# Patient Record
Sex: Female | Born: 1990 | Race: White | Hispanic: No | Marital: Married | Smoking: Current every day smoker
Health system: Southern US, Community
[De-identification: ages and names within clinical notes are randomized; demographics above are authoritative.]

## PROBLEM LIST (undated history)

## (undated) DIAGNOSIS — L309 Dermatitis, unspecified: Secondary | ICD-10-CM

## (undated) DIAGNOSIS — Z9889 Other specified postprocedural states: Secondary | ICD-10-CM

## (undated) DIAGNOSIS — Z1501 Genetic susceptibility to malignant neoplasm of breast: Secondary | ICD-10-CM

## (undated) DIAGNOSIS — N809 Endometriosis, unspecified: Secondary | ICD-10-CM

## (undated) DIAGNOSIS — R112 Nausea with vomiting, unspecified: Secondary | ICD-10-CM

## (undated) DIAGNOSIS — F419 Anxiety disorder, unspecified: Secondary | ICD-10-CM

## (undated) HISTORY — PX: TONSILLECTOMY: SUR1361

## (undated) HISTORY — PX: BREAST SURGERY: SHX581

---

## 2009-03-06 HISTORY — PX: IUD REMOVAL: SHX5392

## 2017-05-19 ENCOUNTER — Emergency Department: Payer: Worker's Compensation

## 2017-05-19 ENCOUNTER — Encounter: Payer: Self-pay | Admitting: Emergency Medicine

## 2017-05-19 ENCOUNTER — Other Ambulatory Visit: Payer: Self-pay

## 2017-05-19 ENCOUNTER — Emergency Department
Admission: EM | Admit: 2017-05-19 | Discharge: 2017-05-19 | Disposition: A | Discharge status: Home / Self Care | Payer: Worker's Compensation | Attending: Emergency Medicine | Admitting: Emergency Medicine

## 2017-05-19 DIAGNOSIS — S8701XA Crushing injury of right knee, initial encounter: Secondary | ICD-10-CM

## 2017-05-19 DIAGNOSIS — S8781XA Crushing injury of right lower leg, initial encounter: Secondary | ICD-10-CM

## 2017-05-19 DIAGNOSIS — M79605 Pain in left leg: Secondary | ICD-10-CM | POA: Diagnosis not present

## 2017-05-19 DIAGNOSIS — M79604 Pain in right leg: Secondary | ICD-10-CM

## 2017-05-19 DIAGNOSIS — S8991XA Unspecified injury of right lower leg, initial encounter: Secondary | ICD-10-CM

## 2017-05-19 DIAGNOSIS — S8702XA Crushing injury of left knee, initial encounter: Secondary | ICD-10-CM

## 2017-05-19 MED ORDER — KETOROLAC TROMETHAMINE 60 MG/2ML IM SOLN
INTRAMUSCULAR | Status: AC
  Administered 2017-05-19: 30 mg
  Filled 2017-05-19: qty 2

## 2017-05-19 MED ORDER — TRAMADOL HCL 50 MG PO TABS: 50.0000 mg | ORAL_TABLET | Freq: Four times a day (QID) | ORAL | 0 refills | Status: DC | PRN

## 2017-05-19 MED ORDER — KETOROLAC TROMETHAMINE 30 MG/ML IJ SOLN: 30.0000 mg | Freq: Once | INTRAMUSCULAR | Status: DC
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
## 2017-05-19 NOTE — ED Notes (Signed)
Pt verbalized understanding of discharge instructions. NAD at this time. 
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
## 2017-12-25 ENCOUNTER — Emergency Department (HOSPITAL_BASED_OUTPATIENT_CLINIC_OR_DEPARTMENT_OTHER)
Admission: EM | Admit: 2017-12-25 | Discharge: 2017-12-25 | Disposition: A | Discharge status: Home / Self Care | Payer: PRIVATE HEALTH INSURANCE | Attending: Emergency Medicine | Admitting: Emergency Medicine

## 2017-12-25 ENCOUNTER — Emergency Department (HOSPITAL_BASED_OUTPATIENT_CLINIC_OR_DEPARTMENT_OTHER): Payer: PRIVATE HEALTH INSURANCE

## 2017-12-25 ENCOUNTER — Encounter (HOSPITAL_BASED_OUTPATIENT_CLINIC_OR_DEPARTMENT_OTHER): Payer: Self-pay | Admitting: Emergency Medicine

## 2017-12-25 ENCOUNTER — Other Ambulatory Visit: Payer: Self-pay

## 2017-12-25 DIAGNOSIS — R51 Headache: Secondary | ICD-10-CM | POA: Insufficient documentation

## 2017-12-25 DIAGNOSIS — Z79899 Other long term (current) drug therapy: Secondary | ICD-10-CM | POA: Insufficient documentation

## 2017-12-25 DIAGNOSIS — R519 Headache, unspecified: Secondary | ICD-10-CM

## 2017-12-25 LAB — COMPREHENSIVE METABOLIC PANEL
ALBUMIN: 3.9 g/dL (ref 3.5–5.0)
ALT: 12 U/L (ref 0–44)
ANION GAP: 10 (ref 5–15)
AST: 18 U/L (ref 15–41)
Alkaline Phosphatase: 60 U/L (ref 38–126)
BUN: 8 mg/dL (ref 6–20)
CO2: 20 mmol/L — AB (ref 22–32)
Calcium: 9 mg/dL (ref 8.9–10.3)
Chloride: 106 mmol/L (ref 98–111)
Creatinine, Ser: 0.7 mg/dL (ref 0.44–1.00)
GFR calc Af Amer: >60 mL/min (ref >60)
GFR calc non Af Amer: >60 mL/min (ref >60)
GLUCOSE: 120 mg/dL — AB (ref 70–99)
POTASSIUM: 3.4 mmol/L — AB (ref 3.5–5.1)
Sodium: 136 mmol/L (ref 135–145)
Total Bilirubin: 0.5 mg/dL (ref 0.3–1.2)
Total Protein: 7.2 g/dL (ref 6.5–8.1)

## 2017-12-25 LAB — CBC WITH DIFFERENTIAL/PLATELET
Basophils Absolute: 0 10*3/uL (ref 0.0–0.1)
Basophils Relative: 0 %
EOS ABS: 0.2 10*3/uL (ref 0.0–0.7)
Eosinophils Relative: 1 %
HCT: 39.2 % (ref 36.0–46.0)
HEMOGLOBIN: 13.4 g/dL (ref 12.0–15.0)
LYMPHS PCT: 19 %
Lymphs Abs: 2.4 10*3/uL (ref 0.7–4.0)
MCH: 29.5 pg (ref 26.0–34.0)
MCHC: 34.2 g/dL (ref 30.0–36.0)
MCV: 86.3 fL (ref 78.0–100.0)
Monocytes Absolute: 0.7 10*3/uL (ref 0.1–1.0)
Monocytes Relative: 6 %
NEUTROS PCT: 74 %
Neutro Abs: 9.3 10*3/uL — ABNORMAL HIGH (ref 1.7–7.7)
Platelets: 177 10*3/uL (ref 150–400)
RBC: 4.54 MIL/uL (ref 3.87–5.11)
RDW: 13 % (ref 11.5–15.5)
WBC: 12.6 10*3/uL — AB (ref 4.0–10.5)

## 2017-12-25 LAB — PREGNANCY, URINE: Preg Test, Ur: NEGATIVE

## 2017-12-25 MED ORDER — METOCLOPRAMIDE HCL 5 MG/ML IJ SOLN
10.0000 mg | Freq: Once | INTRAMUSCULAR | Status: AC
  Administered 2017-12-25: 10 mg via INTRAVENOUS
  Filled 2017-12-25: qty 2

## 2017-12-25 MED ORDER — SODIUM CHLORIDE 0.9 % IV BOLUS
500.0000 mL | Freq: Once | INTRAVENOUS | Status: AC
  Administered 2017-12-25: 500 mL via INTRAVENOUS

## 2017-12-25 MED ORDER — POTASSIUM CHLORIDE CRYS ER 20 MEQ PO TBCR
40.0000 meq | EXTENDED_RELEASE_TABLET | Freq: Once | ORAL | Status: AC
  Administered 2017-12-25: 40 meq via ORAL
  Filled 2017-12-25: qty 2

## 2017-12-25 MED ORDER — METHOCARBAMOL 500 MG PO TABS
750.0000 mg | ORAL_TABLET | Freq: Once | ORAL | Status: AC
  Administered 2017-12-25: 750 mg via ORAL
  Filled 2017-12-25: qty 2

## 2017-12-25 MED ORDER — METHOCARBAMOL 500 MG PO TABS: 500.0000 mg | ORAL_TABLET | Freq: Every evening | ORAL | 0 refills | Status: DC | PRN

## 2017-12-25 MED ORDER — DIPHENHYDRAMINE HCL 50 MG/ML IJ SOLN
25.0000 mg | Freq: Once | INTRAMUSCULAR | Status: AC
  Administered 2017-12-25: 25 mg via INTRAVENOUS
  Filled 2017-12-25: qty 1

## 2017-12-25 MED ORDER — MORPHINE SULFATE (PF) 2 MG/ML IV SOLN
2.0000 mg | Freq: Once | INTRAVENOUS | Status: DC
  Filled 2017-12-25: qty 1

## 2017-12-25 NOTE — ED Notes (Signed)
Pt took tramadol PTA.

## 2017-12-25 NOTE — ED Triage Notes (Signed)
Patient states that she was called in Prednisone and Cleocin and she started to take it from the dentist. She states that she she started to have a reaction and was told by the MD to stop taking them. The patient then states that she woke up with a headache this am and went to Urgent care then sent here.  Patient is crying in pain and holding the base of here neck

## 2017-12-25 NOTE — ED Provider Notes (Signed)
[facility] EMERGENCY DEPARTMENT Provider Note   CSN: [id number] Arrival date & time: 12/25/17  1318     History   Chief Complaint Chief Complaint  Patient presents with  . Headache    HPI Sheri Murray is a 27 y.o. female presenting for evaluation of headache.  Patient states she had dental fillings done 5 days ago.  She has been having continued dental pain, called her dentist yesterday and was prescribed prednisone and clindamycin.  She took this last night, felt poorly afterwards.  This morning when she woke up, she went from sitting to standing when she had acute onset right sided headache.  Headache began at 930, 4 hrs PTA. Since then, she is having severe head pain which is described as a shooting pain starting at the base of her neck going towards the front of her head.  It is always on the right side.  Is worse with certain positions.  It is improved when she is resting and still.  She took tramadol without significant improvement of her symptoms.  She denies fall, trauma, or injury.  She denies fevers or chills.  She denies pain in the midline of her neck.  She denies photophobia or phonophobia.  She reports a history of migraines, states this feels different.  She denies vision changes, slurred speech, decreased concentration, numbness, tingling.    HPI  History reviewed. No pertinent past medical history.  There are no active problems to display for this patient.   Past Surgical History:  Procedure Laterality Date  . BREAST SURGERY     lumpectomy     OB History   None      Home Medications    Prior to Admission medications   Medication Sig Start Date End Date Taking? Authorizing Provider  methocarbamol (ROBAXIN) 500 MG tablet Take 1 tablet (500 mg total) by mouth at bedtime as needed for muscle spasms. 12/25/17   Danzel Marszalek, PA-C  traMADol (ULTRAM) 50 MG tablet Take 1 tablet (50 mg total) by mouth every 6 (six) hours as needed. 05/19/17   Sherrie MustacheFisher,  Roselyn BeringSusan W, PA-C    Family History History reviewed. No pertinent family history.  Social History Social History   Tobacco Use  . Smoking status: Never Smoker  . Smokeless tobacco: Never Used  Substance Use Topics  . Alcohol use: No    Frequency: Never  . Drug use: No     Allergies   Bactrim [sulfamethoxazole-trimethoprim]; Ivp dye [iodinated diagnostic agents]; Latex; and Penicillins   Review of Systems Review of Systems  Neurological: Positive for headaches.  All other systems reviewed and are negative.    Physical Exam Updated Vital Signs BP 114/62 (BP Location: Right Arm)   Pulse 62   Temp 98.1 F (36.7 C) (Oral)   Resp 17   Ht 5\' 5"  (1.651 m)   Wt 70.3 kg   LMP 11/28/2017   SpO2 100%   BMI 25.79 kg/m   Physical Exam  Constitutional: She is oriented to person, place, and time. She appears well-developed and well-nourished. No distress.  Appears in no distress  HENT:  Head: Normocephalic and atraumatic.  No obvious head injury.  OP clear without tonsillar swelling or exudate.  Uvula midline with equal palate rise.  TMs nonerythematous and not bulging bilaterally.  No obvious dental swelling/infection  Eyes: Pupils are equal, round, and reactive to light. Conjunctivae and EOM are normal.  EOMI and PERRLA.  No nystagmus.  Neck: Normal range of motion.  Neck supple.  No tenderness palpation over midline C-spine.  Moving head during the exam without signs of stiffness or meningismus.  Cardiovascular: Normal rate, regular rhythm and intact distal pulses.  Pulmonary/Chest: Effort normal and breath sounds normal. No respiratory distress. She has no wheezes.  Abdominal: Soft. She exhibits no distension and no mass. There is no tenderness. There is no rebound and no guarding.  Musculoskeletal: Normal range of motion.  Strength intact x4.  Sensation intact x4.  Radial and pedal pulses intact bilaterally.  Neurological: She is alert and oriented to person, place, and  time. She displays normal reflexes. No cranial nerve deficit or sensory deficit. Coordination normal.  No obvious neurologic deficits.  Nose to finger intact.  Grip strength intact.  CN intact.  Fine movement and coordination intact.  Skin: Skin is warm and dry. Capillary refill takes less than 2 seconds.  Psychiatric: She has a normal mood and affect.  Nursing note and vitals reviewed.    ED Treatments / Results  Labs (all labs ordered are listed, but only abnormal results are displayed) Labs Reviewed  CBC WITH DIFFERENTIAL/PLATELET - Abnormal; Notable for the following components:      Result Value   WBC 12.6 (*)    Neutro Abs 9.3 (*)    All other components within normal limits  COMPREHENSIVE METABOLIC PANEL - Abnormal; Notable for the following components:   Potassium 3.4 (*)    CO2 20 (*)    Glucose, Bld 120 (*)    All other components within normal limits  PREGNANCY, URINE    EKG EKG Interpretation  Date/Time:  Saturday December 25 2017 14:27:49 EDT Ventricular Rate:  63 PR Interval:    QRS Duration: 86 QT Interval:  427 QTC Calculation: 438 R Axis:   78 Text Interpretation:  Normal sinus rhythm no acute ST/T changes No old tracing to compare Confirmed by Pricilla Loveless [phone]) on 12/25/2017 2:32:12 PM   Radiology Ct Head Wo Contrast  Result Date: 12/25/2017 CLINICAL DATA:  Acute right posterior headache, history of migraines EXAM: CT HEAD WITHOUT CONTRAST TECHNIQUE: Contiguous axial images were obtained from the base of the skull through the vertex without intravenous contrast. COMPARISON:  None. FINDINGS: Brain: No evidence of acute infarction, hemorrhage, hydrocephalus, extra-axial collection or mass lesion/mass effect. Vascular: No hyperdense vessel or unexpected calcification. Skull: Normal. Negative for fracture or focal lesion. Sinuses/Orbits: No acute finding. Other: None. IMPRESSION: Normal head CT without contrast Electronically Signed   By: Judie Petit.  Shick M.D.    On: 12/25/2017 14:34    Procedures Procedures (including critical care time)  Medications Ordered in ED Medications  sodium chloride 0.9 % bolus 500 mL ( Intravenous Stopped 12/25/17 1438)  methocarbamol (ROBAXIN) tablet 750 mg (750 mg Oral Given 12/25/17 1410)  metoCLOPramide (REGLAN) injection 10 mg (10 mg Intravenous Given 12/25/17 1411)  diphenhydrAMINE (BENADRYL) injection 25 mg (25 mg Intravenous Given 12/25/17 1411)  potassium chloride SA (K-DUR,KLOR-CON) CR tablet 40 mEq (40 mEq Oral Given 12/25/17 1442)     Initial Impression / Assessment and Plan / ED Course  I have reviewed the triage vital signs and the nursing notes.  Pertinent labs & imaging results that were available during my care of the patient were reviewed by me and considered in my medical decision making (see chart for details).     Patient presenting for evaluation of headache.  Physical exam shows patient without acute neurologic deficits.  However, reporting severe headache which began 4 hours prior to arrival.  Will obtain CT to rule out subarachnoid, basic labs, start fluids, and treat pain and reassess.  Labs reassuring, mild leukocytosis at 12.6, patient with recent surgery and increased pain.  Doubt this is due to infection such as meningitis.  Potassium mildly low at 3.4, repeated orally.  Pregnancy negative.  CT head without acute findings.  As scan was performed within the 6-hour window, and patient with nonclassic SAH symptoms, low suspicion for [facility].  I do not believe lumbar puncture is necessary.  On reassessment, patient reports symptoms are improved with medications.  Discussed with attending, Dr. Criss Alvine evaluated patient.  Discussed with patient symptoms may be due to occipital nerve irritation, MSK pain, or recent dental procedure.  Patient to stay well-hydrated, use Tylenol/ibuprofen, and follow-up with dentistry regarding her dental pain.  Robaxin for possible MSK sxs. At this time, patient appears safe  for discharge.  Return precautions given.  Patient states she understands and agrees to plan.  Final Clinical Impressions(s) / ED Diagnoses   Final diagnoses:  Acute nonintractable headache, unspecified headache type    ED Discharge Orders         Ordered    methocarbamol (ROBAXIN) 500 MG tablet  At bedtime PRN     12/25/17 1547           Orvil Faraone, PA-C 12/25/17 2054    Pricilla Loveless, MD 12/26/17 [phone]

## 2017-12-25 NOTE — Discharge Instructions (Signed)
Make sure you are staying well-hydrated with water. Use ibuprofen as needed for mild to moderate pain.  You may take your tramadol as needed for severe pain. Use a muscle relaxer as needed for pain or stiffness.  Have caution, as this may make you tired or groggy.  Do not drive or operate heavy machinery while taking this medicine. Follow-up with your dentist regarding your tooth pain. Return to the emergency room if you develop high fevers, vision changes, slurred speech, numbness/tingling, or any new or concerning symptoms.

## 2017-12-25 NOTE — ED Notes (Signed)
ED Provider at bedside. 

## 2019-08-26 ENCOUNTER — Ambulatory Visit (INDEPENDENT_AMBULATORY_CARE_PROVIDER_SITE_OTHER): Payer: PRIVATE HEALTH INSURANCE

## 2019-08-26 ENCOUNTER — Ambulatory Visit
Admission: EM | Admit: 2019-08-26 | Discharge: 2019-08-26 | Disposition: A | Discharge status: Home / Self Care | Payer: PRIVATE HEALTH INSURANCE | Attending: Emergency Medicine | Admitting: Emergency Medicine

## 2019-08-26 ENCOUNTER — Encounter: Payer: Self-pay | Admitting: Emergency Medicine

## 2019-08-26 ENCOUNTER — Other Ambulatory Visit: Payer: Self-pay

## 2019-08-26 DIAGNOSIS — S63502A Unspecified sprain of left wrist, initial encounter: Secondary | ICD-10-CM

## 2019-08-26 DIAGNOSIS — W19XXXA Unspecified fall, initial encounter: Secondary | ICD-10-CM | POA: Diagnosis not present

## 2019-08-26 DIAGNOSIS — M25532 Pain in left wrist: Secondary | ICD-10-CM
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
## 2019-08-26 NOTE — Discharge Instructions (Addendum)

## 2019-08-26 NOTE — ED Provider Notes (Signed)
[name]    CSN: [id number] Arrival date & time: 08/26/19  1158      History   Chief Complaint Chief Complaint  Patient presents with  . Wrist Pain    HPI Sheri Murray is a 29 y.o. female without significant medical history presenting for left wrist pain, decreased range of motion, swelling.  States she fell Wednesday/Thursday.  Did suffer abrasion to left wrist, though has been caring for this at home with OTC medications.  Denying significant pain, numbness, fevers.   History reviewed. No pertinent past medical history.  There are no problems to display for this patient.   Past Surgical History:  Procedure Laterality Date  . BREAST SURGERY     lumpectomy    OB History   No obstetric history on file.      Home Medications    Prior to Admission medications   Medication Sig Start Date End Date Taking? Authorizing Provider  methocarbamol (ROBAXIN) 500 MG tablet Take 1 tablet (500 mg total) by mouth at bedtime as needed for muscle spasms. 12/25/17   Murray, Sophia, PA-C  traMADol (ULTRAM) 50 MG tablet Take 1 tablet (50 mg total) by mouth every 6 (six) hours as needed. 05/19/17   Sheri Mustache Roselyn Bering, PA-C    Family History History reviewed. No pertinent family history.  Social History Social History   Tobacco Use  . Smoking status: Never Smoker  . Smokeless tobacco: Never Used  Substance Use Topics  . Alcohol use: No  . Drug use: No     Allergies   Bactrim [sulfamethoxazole-trimethoprim], Ivp dye [iodinated diagnostic agents], Latex, and Penicillins   Review of Systems As per HPI   Physical Exam Triage Vital Signs ED Triage Vitals [08/26/19 1237]  Enc Vitals Group     BP 135/87     Pulse Rate 78     Resp 16     Temp 98.3 F (36.8 C)     Temp Source Oral     SpO2 99 %     Weight      Height      Head Circumference      Peak Flow      Pain Score      Pain Loc      Pain Edu?      Excl. in GC?    No data found.  Updated  Vital Signs BP 135/87 (BP Location: Right Arm)   Pulse 78   Temp 98.3 F (36.8 C) (Oral)   Resp 16   SpO2 99%   Visual Acuity Right Eye Distance:   Left Eye Distance:   Bilateral Distance:    Right Eye Near:   Left Eye Near:    Bilateral Near:     Physical Exam Constitutional:      General: She is not in acute distress. HENT:     Head: Normocephalic and atraumatic.  Eyes:     General: No scleral icterus.    Pupils: Pupils are equal, round, and reactive to light.  Cardiovascular:     Rate and Rhythm: Normal rate.  Pulmonary:     Effort: Pulmonary effort is normal.  Musculoskeletal:        General: Swelling and tenderness present.     Comments: Decreased ROM second to swelling.  Lateral aspect of wrist more swollen as compared to medial.  Grip strength decreased.  Neurovascularly intact.  1 cm abrasion noted: No foreign body, bleeding, tenderness, warmth.  Skin:    Coloration:  Skin is not jaundiced or pale.  Neurological:     Mental Status: She is alert and oriented to person, place, and time.      UC Treatments / Results  Labs (all labs ordered are listed, but only abnormal results are displayed) Labs Reviewed - No data to display  EKG   Radiology DG Wrist Complete Left  Result Date: 08/26/2019 CLINICAL DATA:  Left wrist pain since falling 2 days ago. EXAM: LEFT WRIST - COMPLETE 3+ VIEW COMPARISON:  None. FINDINGS: The mineralization and alignment are normal. There is no evidence of acute fracture or dislocation. The joint spaces are preserved. No focal soft tissue abnormality identified. IMPRESSION: No acute osseous findings. Electronically Signed   By: Richardean Sale M.D.   On: 08/26/2019 13:04    Procedures Procedures (including critical care time)  Medications Ordered in UC Medications - No data to display  Initial Impression / Assessment and Plan / UC Course  I have reviewed the triage vital signs and the nursing notes.  Pertinent labs & imaging  results that were available during my care of the patient were reviewed by me and considered in my medical decision making (see chart for details).     Patient appears well in office today.  Left wrist with swelling, decreased ROM status post fall.  Left wrist x-ray done office, reviewed by me radiology: Negative for soft tissue abnormality or fracture, dislocation.  Reviewed findings of patient verbalized understanding.  Agreeable to wrist brace: Applied in office which patient tolerated well.  Stressed importance of rehab exercises, which were provided, as well as Ortho follow-up.  Return precautions discussed, patient verbalized understanding and is agreeable to plan. Final Clinical Impressions(s) / UC Diagnoses   Final diagnoses:  Fall, initial encounter  Sprain of left wrist, initial encounter     Discharge Instructions     Recommend RICE: rest, ice, compression, elevation as needed for pain.    Heat therapy (hot compress, warm wash rag, hot showers, etc.) can help relax muscles and soothe muscle aches. Cold therapy (ice packs) can be used to help swelling both after injury and after prolonged use of areas of chronic pain/aches.  For pain: recommend 350 mg-1000 mg of Tylenol (acetaminophen) and/or 200 mg - 800 mg of Advil (ibuprofen, Motrin) every 8 hours as needed.  May alternate between the two throughout the day as they are generally safe to take together.  DO NOT exceed more than 3000 mg of Tylenol or 3200 mg of ibuprofen in a 24 hour period as this could damage your stomach, kidneys, liver, or increase your bleeding risk.     ED Prescriptions    None     PDMP not reviewed this encounter.   Hall[place]Pot[place], [place], [place] 08/26/19 1314

## 2019-10-21 IMAGING — CT CT HEAD W/O CM
3 series · 16 of 47 positions shown, 19 images · non-contrast
Comparison: None.

CLINICAL DATA: Acute right posterior headache, history of migraines

EXAM:
CT HEAD WITHOUT CONTRAST
TECHNIQUE: Contiguous axial images were obtained from the base of the skull
through the vertex without intravenous contrast.

[Series 2: head wo · axial · 0.41mm/px · z∈[-169,-44]mm · 10 of 31 slices shown, 13 images]
[im 3/31  brain]
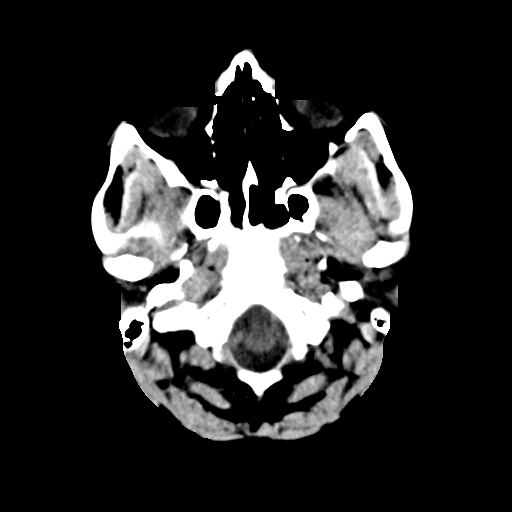
[im 3/31  bone]
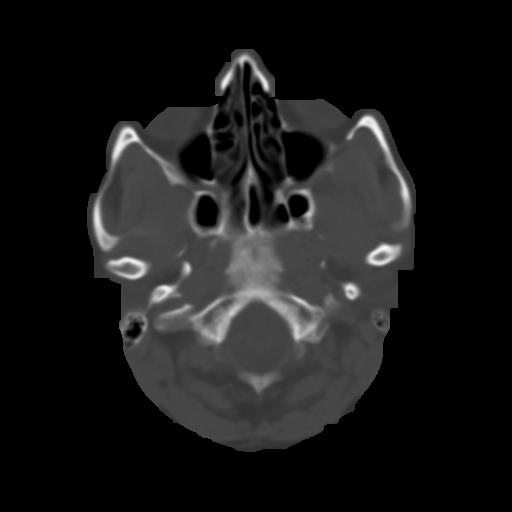
[im 6/31  brain]
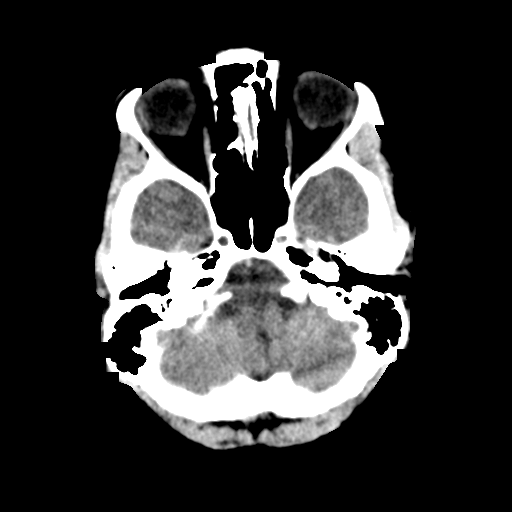
[im 9/31  brain]
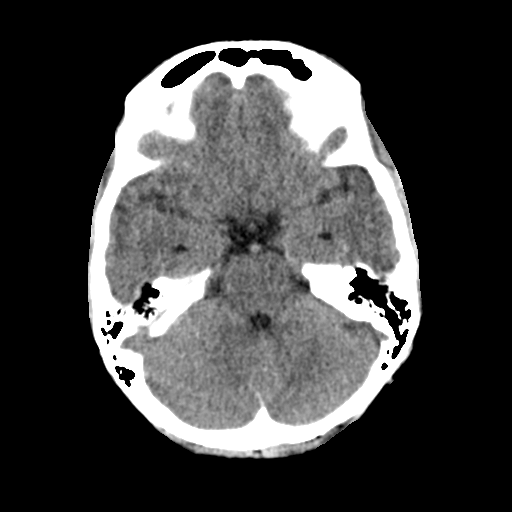
[im 11/31  brain]
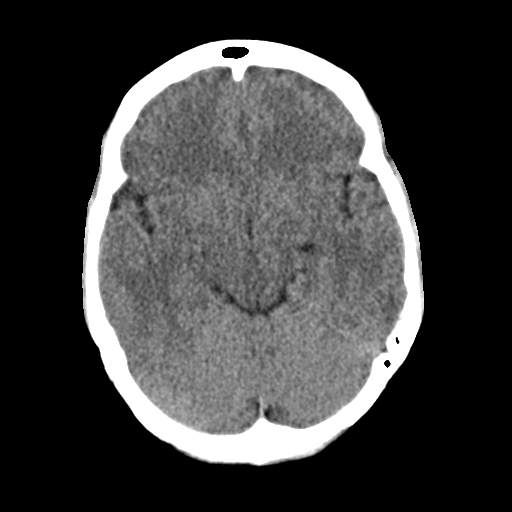
[im 14/31  brain]
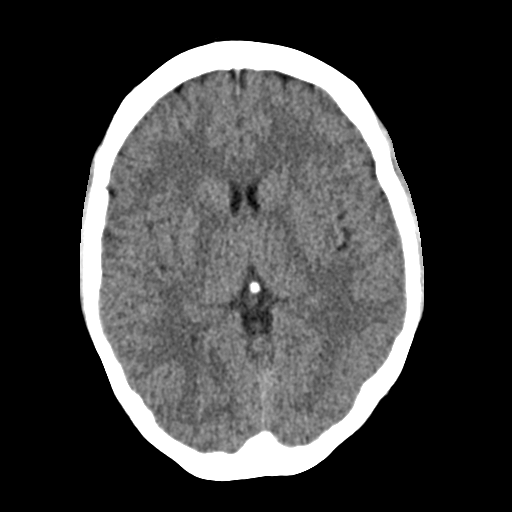
[im 14/31  bone]
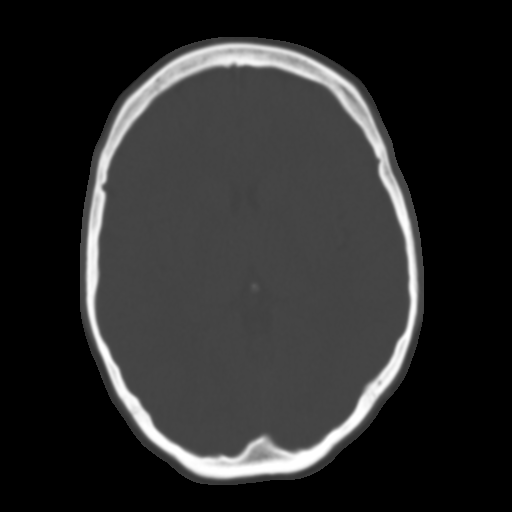
[im 17/31  brain]
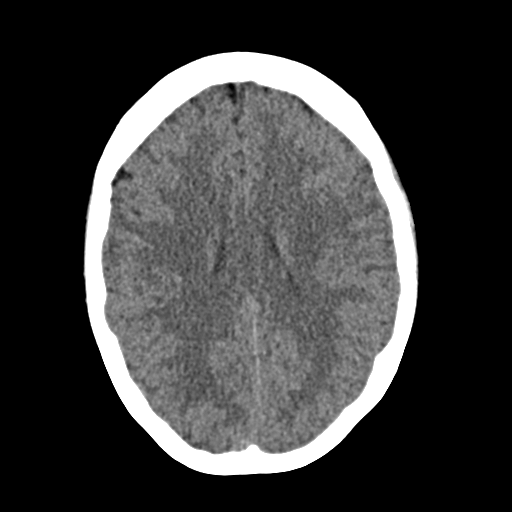
[im 20/31  brain]
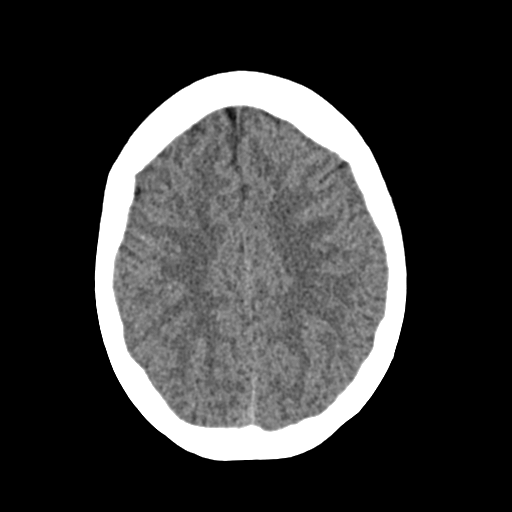
[im 23/31  brain]
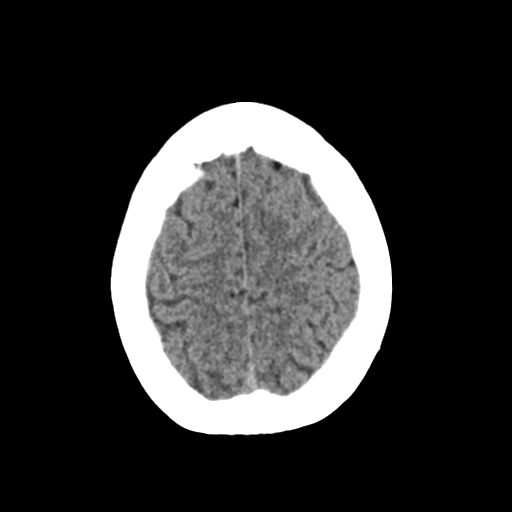
[im 25/31  brain]
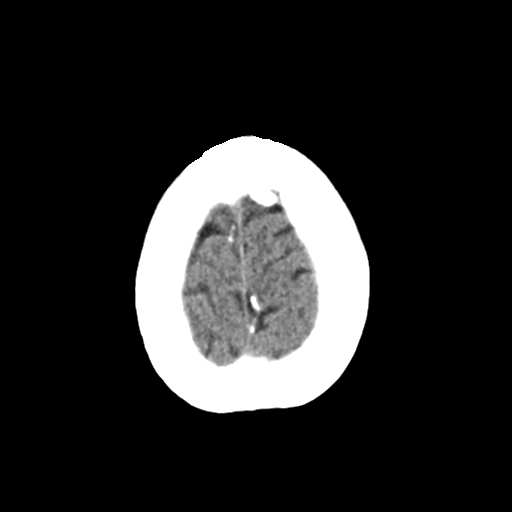
[im 25/31  bone]
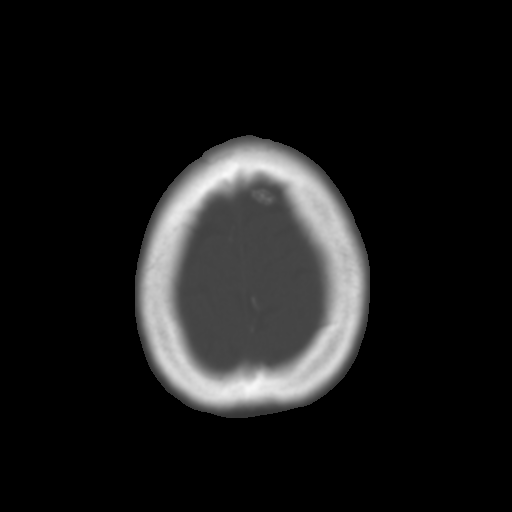
[im 28/31  brain]
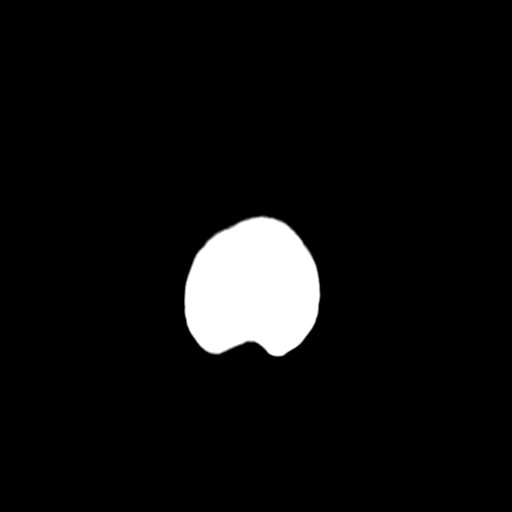

[Series 4: coronal soft · coronal · 0.30mm/px · 3 of 64 slices shown]
[im 22/64  brain]
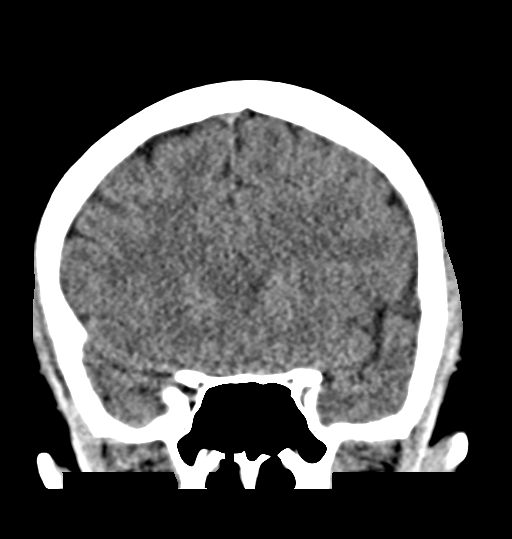
[im 29/64  brain]
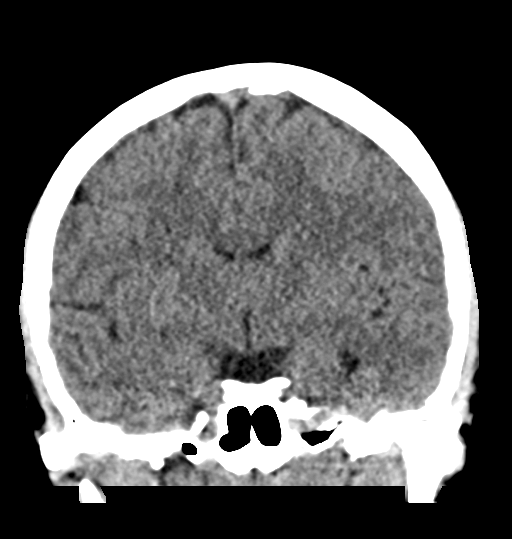
[im 36/64  brain]
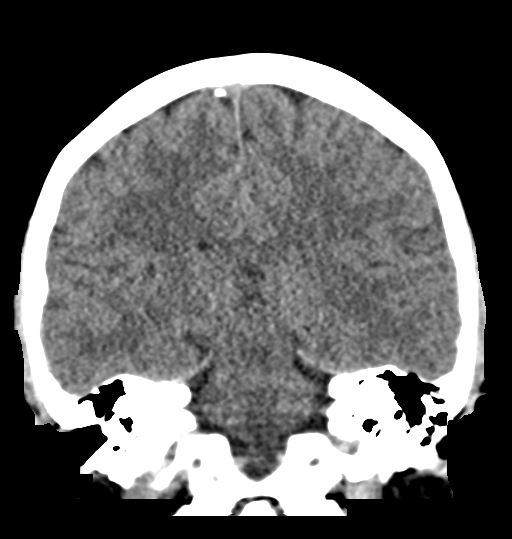

[Series 5: sag soft · sagittal · 0.32mm/px · 3 of 50 slices shown]
[im 17/50  brain]
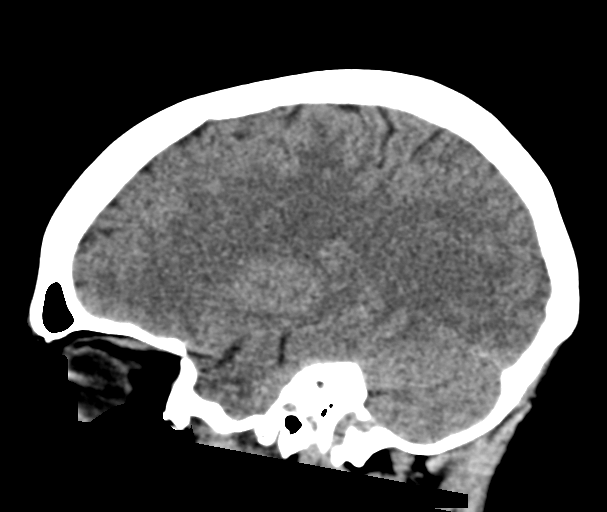
[im 25/50  brain]
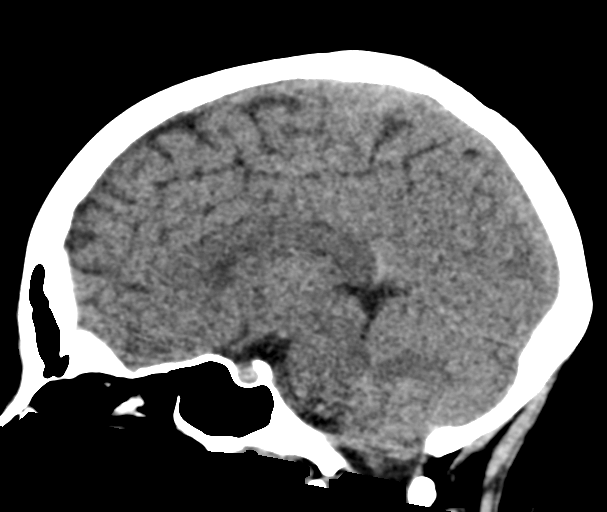
[im 33/50  brain]
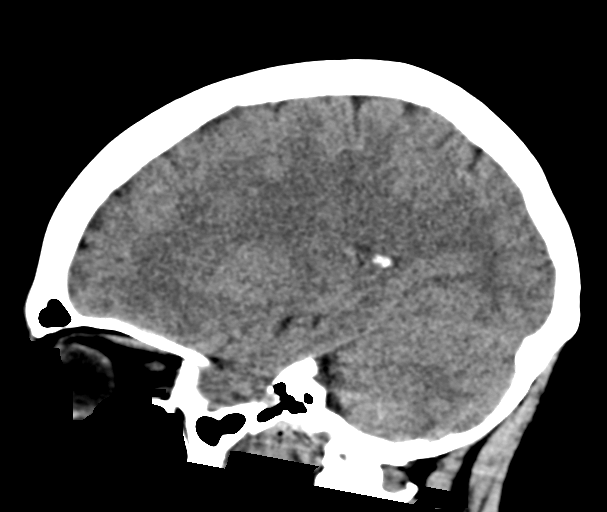

[16 of 47 positions shown; findings below may reference images not displayed]

FINDINGS: Brain: No evidence of acute infarction, hemorrhage, hydrocephalus,
extra-axial collection or mass lesion/mass effect.

Vascular: No hyperdense vessel or unexpected calcification.

Skull: Normal. Negative for fracture or focal lesion.

Sinuses/Orbits: No acute finding.

Other: None.
IMPRESSION: Normal head CT without contrast

## 2020-05-26 ENCOUNTER — Encounter (HOSPITAL_BASED_OUTPATIENT_CLINIC_OR_DEPARTMENT_OTHER): Payer: Self-pay | Admitting: *Deleted

## 2020-05-26 ENCOUNTER — Other Ambulatory Visit: Payer: Self-pay

## 2020-05-26 ENCOUNTER — Emergency Department (HOSPITAL_BASED_OUTPATIENT_CLINIC_OR_DEPARTMENT_OTHER)
Admission: EM | Admit: 2020-05-26 | Discharge: 2020-05-27 | Disposition: A | Discharge status: Home / Self Care | Payer: BC Managed Care – PPO | Attending: Emergency Medicine | Admitting: Emergency Medicine

## 2020-05-26 DIAGNOSIS — L509 Urticaria, unspecified: Secondary | ICD-10-CM | POA: Insufficient documentation

## 2020-05-26 DIAGNOSIS — F17200 Nicotine dependence, unspecified, uncomplicated: Secondary | ICD-10-CM | POA: Insufficient documentation

## 2020-05-26 DIAGNOSIS — Z91040 Latex allergy status: Secondary | ICD-10-CM | POA: Diagnosis not present

## 2020-05-26 DIAGNOSIS — Z882 Allergy status to sulfonamides status: Secondary | ICD-10-CM | POA: Diagnosis not present

## 2020-05-26 DIAGNOSIS — T7840XA Allergy, unspecified, initial encounter: Secondary | ICD-10-CM | POA: Diagnosis not present

## 2020-05-26 HISTORY — DX: Endometriosis, unspecified: N80.9

## 2020-05-26 LAB — CBC WITH DIFFERENTIAL/PLATELET
Abs Immature Granulocytes: 0.04 10*3/uL (ref 0.00–0.07)
Basophils Absolute: 0 10*3/uL (ref 0.0–0.1)
Basophils Relative: 1 %
Eosinophils Absolute: 0.4 10*3/uL (ref 0.0–0.5)
Eosinophils Relative: 8 %
HCT: 39.3 % (ref 36.0–46.0)
Hemoglobin: 13 g/dL (ref 12.0–15.0)
Immature Granulocytes: 1 %
Lymphocytes Relative: 29 %
Lymphs Abs: 1.2 10*3/uL (ref 0.7–4.0)
MCH: 30.3 pg (ref 26.0–34.0)
MCHC: 33.1 g/dL (ref 30.0–36.0)
MCV: 91.6 fL (ref 80.0–100.0)
Monocytes Absolute: 0.4 10*3/uL (ref 0.1–1.0)
Monocytes Relative: 9 %
Neutro Abs: 2.3 10*3/uL (ref 1.7–7.7)
Neutrophils Relative %: 52 %
Platelets: 175 10*3/uL (ref 150–400)
RBC: 4.29 MIL/uL (ref 3.87–5.11)
RDW: 13.2 % (ref 11.5–15.5)
WBC: 4.3 10*3/uL (ref 4.0–10.5)
nRBC: 0 % (ref 0.0–0.2)

## 2020-05-26 LAB — BASIC METABOLIC PANEL
Anion gap: 8 (ref 5–15)
BUN: 11 mg/dL (ref 6–20)
CO2: 22 mmol/L (ref 22–32)
Calcium: 8.8 mg/dL — ABNORMAL LOW (ref 8.9–10.3)
Chloride: 104 mmol/L (ref 98–111)
Creatinine, Ser: 0.89 mg/dL (ref 0.44–1.00)
GFR, Estimated: >60 mL/min (ref >60)
Glucose, Bld: 117 mg/dL — ABNORMAL HIGH (ref 70–99)
Potassium: 3.7 mmol/L (ref 3.5–5.1)
Sodium: 134 mmol/L — ABNORMAL LOW (ref 135–145)

## 2020-05-26 MED ORDER — FAMOTIDINE IN NACL 20-0.9 MG/50ML-% IV SOLN
20.0000 mg | Freq: Once | INTRAVENOUS | Status: AC
  Administered 2020-05-26: 20 mg via INTRAVENOUS
  Filled 2020-05-26: qty 50

## 2020-05-26 MED ORDER — SODIUM CHLORIDE 0.9 % IV SOLN: INTRAVENOUS | Status: DC | PRN

## 2020-05-26 MED ORDER — DIPHENHYDRAMINE HCL 50 MG/ML IJ SOLN
25.0000 mg | Freq: Once | INTRAMUSCULAR | Status: AC
  Administered 2020-05-26: 25 mg via INTRAVENOUS
  Filled 2020-05-26: qty 1

## 2020-05-26 MED ORDER — METHYLPREDNISOLONE SODIUM SUCC 125 MG IJ SOLR
125.0000 mg | Freq: Once | INTRAMUSCULAR | Status: AC
  Administered 2020-05-26: 125 mg via INTRAVENOUS
  Filled 2020-05-26: qty 2

## 2020-05-26 NOTE — ED Provider Notes (Incomplete)
[facility] EMERGENCY DEPARTMENT Provider Note   CSN: [id number] Arrival date & time: 05/26/20  2145     History Chief Complaint  Patient presents with  . Allergic Reaction    Sheri Murray is a 30 y.o. female who presents with concern for allergic reaction that started today with hives on her arms that are spreading to her trunk, neck, and legs.  She states that she was recently diagnosed with shingles on her left shoulder at urgent care and was given prescription for Valtrex and an antibiotic which she started 1 week ago today.  Patient endorses history of allergic reaction to Bactrim in the past.  She states that she does not know what antibiotic she is on at this time.  She does endorse use of oral ketorolac per urgent care provider, and states that she had an injection of Toradol yesterday in the urgent care and was prescribed oral Toradol which she started today.  She states that the reaction started several hours after she took her oral dose of Toradol.  There is associated itching with the rash, and she endorses similar reaction in the past.  Upon review of her chart, it does appear this patient was prescribed Bactrim at the urgent care which has been taking for 1 week.  She has not had any problems eating up to this point.  At time of initial exam to endorses sensation of tightness in her throat, but is able to swallow and is not having any difficulty breathing.  Primary concern is this itchy rash.  She states that she took 10 mg of diphenhydramine at home.  HPI     History reviewed. No pertinent past medical history.  There are no problems to display for this patient.   Past Surgical History:  Procedure Laterality Date  . BREAST SURGERY     lumpectomy     OB History   No obstetric history on file.     Family History  Problem Relation Age of Onset  . Cancer Mother   . Osteoarthritis Mother   . Diabetes Father     Social History   Tobacco Use  .  Smoking status: Never Smoker  . Smokeless tobacco: Never Used  Substance Use Topics  . Alcohol use: Yes  . Drug use: No    Home Medications Prior to Admission medications   Medication Sig Start Date End Date Taking? Authorizing Provider  desogestrel-ethinyl estradiol (MIRCETTE) 0.15-0.02/0.01 MG (21/5) tablet Take by mouth. 09/16/18   [provider]  methocarbamol (ROBAXIN) 500 MG tablet Take 1 tablet (500 mg total) by mouth at bedtime as needed for muscle spasms. 12/25/17   Caccavale, Sophia, PA-C  traMADol (ULTRAM) 50 MG tablet Take 1 tablet (50 mg total) by mouth every 6 (six) hours as needed. 05/19/17   Faythe Ghee, PA-C    Allergies    Bactrim [sulfamethoxazole-trimethoprim], Ivp dye [iodinated diagnostic agents], Latex, and Penicillins  Review of Systems   Review of Systems  Constitutional: Negative.   HENT: Positive for sore throat.        Throat tightness  Eyes: Negative.   Respiratory: Positive for chest tightness. Negative for cough and shortness of breath.   Cardiovascular: Negative.   Gastrointestinal: Negative.   Genitourinary: Negative.   Musculoskeletal: Negative.   Skin: Positive for rash.  Neurological: Negative.   Hematological: Negative.     Physical Exam Updated Vital Signs BP 137/73 (BP Location: Right Arm)   Pulse 80   Temp 98.4  F (36.9 C) (Oral)   Resp 16   SpO2 100%   Physical Exam Vitals and nursing note reviewed.  HENT:     Head: Normocephalic and atraumatic.     Nose: Nose normal.     Mouth/Throat:     Mouth: Mucous membranes are moist.     Tongue: No lesions. Tongue does not deviate from midline.     Pharynx: Oropharynx is clear. Uvula midline. No oropharyngeal exudate, posterior oropharyngeal erythema or uvula swelling.     Tonsils: No tonsillar exudate.  Eyes:     General: Lids are normal. Vision grossly intact.        Right eye: No discharge.        Left eye: No discharge.     Extraocular Movements: Extraocular  movements intact.     Conjunctiva/sclera: Conjunctivae normal.     Pupils: Pupils are equal, round, and reactive to light.  Neck:     Trachea: Trachea and phonation normal.  Cardiovascular:     Rate and Rhythm: Normal rate and regular rhythm.     Pulses: Normal pulses.     Heart sounds: Normal heart sounds. No murmur heard.   Pulmonary:     Effort: Pulmonary effort is normal. No tachypnea, bradypnea or respiratory distress.     Breath sounds: Normal breath sounds. No wheezing or rales.  Chest:     Chest wall: No lacerations, deformity, swelling, tenderness, crepitus or edema.  Abdominal:     General: Bowel sounds are normal. There is no distension.     Palpations: Abdomen is soft.     Tenderness: There is no abdominal tenderness. There is no guarding or rebound.  Musculoskeletal:        General: No deformity.     Cervical back: Neck supple. No tenderness or crepitus. No pain with movement or spinous process tenderness.     Right lower leg: No edema.     Left lower leg: No edema.  Lymphadenopathy:     Cervical: No cervical adenopathy.  Skin:    General: Skin is warm and dry.     Capillary Refill: Capillary refill takes less than 2 seconds.     Findings: Burn and rash present.       Neurological:     Mental Status: She is alert and oriented to person, place, and time. Mental status is at baseline.  Psychiatric:        Mood and Affect: Mood normal.     ED Results / Procedures / Treatments   Labs (all labs ordered are listed, but only abnormal results are displayed) Labs Reviewed - No data to display  EKG None  Radiology No results found.  Procedures Procedures {Remember to document critical care time when appropriate:1}  Medications Ordered in ED Medications  diphenhydrAMINE (BENADRYL) injection 25 mg (has no administration in time range)  methylPREDNISolone sodium succinate (SOLU-MEDROL) 125 mg/2 mL injection 125 mg (has no administration in time range)   famotidine (PEPCID) IVPB 20 mg premix (has no administration in time range)    ED Course  I have reviewed the triage vital signs and the nursing notes.  Pertinent labs & imaging results that were available during my care of the patient were reviewed by me and considered in my medical decision making (see chart for details).    MDM Rules/Calculators/A&P                          *** Final  Clinical Impression(s) / ED Diagnoses Final diagnoses:  None    Rx / DC Orders ED Discharge Orders    None

## 2020-05-26 NOTE — ED Notes (Signed)
ED Provider at bedside. 

## 2020-05-26 NOTE — ED Triage Notes (Addendum)
Pt reports she was Dx with shingles one week ago(rash to left  Shoulder). States she was given bactrim and valtrex from Urgent Care. Today she reports breaking out in hives and feeling Sob. She took a half dose of benadryl pta. Fine rash noted to both legs, hives to upper arms

## 2020-05-26 NOTE — ED Provider Notes (Signed)
[facility] EMERGENCY DEPARTMENT Provider Note   CSN: [id number] Arrival date & time: 05/26/20  2145     History Chief Complaint  Patient presents with   Allergic Reaction    Sheri Murray is a 30 y.o. female who presents with concern for allergic reaction that started today with hives on her arms that are spreading to her trunk, neck, and legs.  She states that she was recently diagnosed with shingles on her left shoulder at urgent care and was given prescription for Valtrex and an antibiotic (she does not know the name) which she started 1 week ago today.  Patient endorses history of allergic reaction to Bactrim in the past.  She states that she does not know what antibiotic she is on at this time.  She does endorse use of oral ketorolac per urgent care provider, and states that she had an injection of Toradol yesterday in the urgent care which she tolerated well, and was prescribed oral Toradol which she started today.  She states that the reaction started several hours after she took her oral dose of Toradol.  There is associated itching with the rash, and she endorses similar reaction in the past.  Upon review of her chart, it does appear this patient was prescribed Bactrim at the urgent care which she has been taking for 1 week, which is unfortunately on her allergy list.  She has not had any problems breathing up to this point.  At time of initial exam to endorses sensation of tightness in her throat, but is able to swallow and is not having any difficulty breathing.  Primary concern is this itchy rash.  She states that she took 10 mg of diphenhydramine at home.  I have personally reviewed this patient's medical records she has history of endometriosis and allergic reactions in the past. She is on OCPs daily.   HPI     Past Medical History:  Diagnosis Date   Endometriosis     There are no problems to display for this patient.   Past Surgical History:  Procedure  Laterality Date   BREAST SURGERY     lumpectomy   TONSILLECTOMY       OB History   No obstetric history on file.     Family History  Problem Relation Age of Onset   Cancer Mother    Osteoarthritis Mother    Diabetes Father     Social History   Tobacco Use   Smoking status: Current Every Day Smoker   Smokeless tobacco: Never Used  Vaping Use   Vaping Use: Never used  Substance Use Topics   Alcohol use: Not Currently   Drug use: No    Home Medications Prior to Admission medications   Medication Sig Start Date End Date Taking? Authorizing Provider  famotidine (PEPCID) 20 MG tablet Take 1 tablet (20 mg total) by mouth 2 (two) times daily for 5 days. 05/27/20 06/01/20 Yes Daemon Dowty, Eugene Gaviaebekah R, PA-C  predniSONE (DELTASONE) 50 MG tablet Take 1 tablet (50 mg total) by mouth daily for 5 days. 05/27/20 06/01/20 Yes Mariellen Blaney, Eugene Gaviaebekah R, PA-C  desogestrel-ethinyl estradiol (MIRCETTE) 0.15-0.02/0.01 MG (21/5) tablet Take by mouth. 09/16/18   [provider]  methocarbamol (ROBAXIN) 500 MG tablet Take 1 tablet (500 mg total) by mouth at bedtime as needed for muscle spasms. 12/25/17   Caccavale, Sophia, PA-C  traMADol (ULTRAM) 50 MG tablet Take 1 tablet (50 mg total) by mouth every 6 (six) hours as needed. 05/19/17  Faythe Ghee, PA-C    Allergies    Ivp dye [iodinated diagnostic agents], Latex, Bactrim [sulfamethoxazole-trimethoprim], and Penicillins  Review of Systems   Review of Systems  Constitutional: Negative.   HENT: Positive for sore throat.        Throat tightness  Eyes: Negative.   Respiratory: Negative.  Negative for cough, chest tightness and shortness of breath.   Cardiovascular: Negative.   Gastrointestinal: Negative.   Genitourinary: Negative.   Musculoskeletal: Negative.   Skin: Positive for rash.  Neurological: Negative.   Hematological: Negative.     Physical Exam Updated Vital Signs BP 119/64    Pulse 66    Temp 98.4 F (36.9 C)  (Oral)    Resp 19    Ht 5\' 5"  (1.651 m)    Wt 77.1 kg    LMP 05/17/2020    SpO2 100%    BMI 28.29 kg/m   Physical Exam Vitals and nursing note reviewed.  HENT:     Head: Normocephalic and atraumatic.     Nose: Nose normal.     Mouth/Throat:     Mouth: Mucous membranes are moist.     Dentition: Normal dentition.     Tongue: No lesions. Tongue does not deviate from midline.     Palate: No lesions.     Pharynx: Oropharynx is clear. Uvula midline. No pharyngeal swelling, oropharyngeal exudate, posterior oropharyngeal erythema or uvula swelling.     Tonsils: No tonsillar exudate or tonsillar abscesses.     Comments: No lingual /sublingual/ submental edema or TTP. NO oropharyngeal edema Eyes:     General: Lids are normal. Vision grossly intact.        Right eye: No discharge.        Left eye: No discharge.     Extraocular Movements: Extraocular movements intact.     Conjunctiva/sclera: Conjunctivae normal.     Pupils: Pupils are equal, round, and reactive to light.  Neck:     Trachea: Trachea and phonation normal.  Cardiovascular:     Rate and Rhythm: Normal rate and regular rhythm.     Pulses: Normal pulses.     Heart sounds: Normal heart sounds. No murmur heard.   Pulmonary:     Effort: Pulmonary effort is normal. No tachypnea, bradypnea or respiratory distress.     Breath sounds: Normal breath sounds. No wheezing or rales.  Chest:     Chest wall: No lacerations, deformity, swelling, tenderness, crepitus or edema.  Abdominal:     General: Bowel sounds are normal. There is no distension.     Palpations: Abdomen is soft.     Tenderness: There is no abdominal tenderness. There is no guarding or rebound.  Musculoskeletal:        General: No deformity.     Cervical back: Full passive range of motion without pain, normal range of motion and neck supple. No tenderness or crepitus. No pain with movement or spinous process tenderness.     Right lower leg: No edema.     Left lower leg:  No edema.  Lymphadenopathy:     Cervical: No cervical adenopathy.  Skin:    General: Skin is warm and dry.     Capillary Refill: Capillary refill takes less than 2 seconds.     Findings: Burn and rash present.       Neurological:     General: No focal deficit present.     Mental Status: She is alert and oriented to person, place, and  time. Mental status is at baseline.  Psychiatric:        Mood and Affect: Mood normal.     ED Results / Procedures / Treatments   Labs (all labs ordered are listed, but only abnormal results are displayed) Labs Reviewed  BASIC METABOLIC PANEL - Abnormal; Notable for the following components:      Result Value   Sodium 134 (*)    Glucose, Bld 117 (*)    Calcium 8.8 (*)    All other components within normal limits  CBC WITH DIFFERENTIAL/PLATELET    EKG None  Radiology No results found.  Procedures Procedures   Medications Ordered in ED Medications  diphenhydrAMINE (BENADRYL) injection 25 mg (25 mg Intravenous Given 05/26/20 2232)  methylPREDNISolone sodium succinate (SOLU-MEDROL) 125 mg/2 mL injection 125 mg (125 mg Intravenous Given 05/26/20 2231)  famotidine (PEPCID) IVPB 20 mg premix (0 mg Intravenous Stopped 05/27/20 0100)    ED Course  I have reviewed the triage vital signs and the nursing notes.  Pertinent labs & imaging results that were available during my care of the patient were reviewed by me and considered in my medical decision making (see chart for details).  Clinical Course as of 05/28/20 2107  Wynelle Link May 26, 2020  2315 Patient reevaluated with improvement in her symptoms. She states that her throat is no longer itchy, does not feel tight. There is improvement in the itching of her rash, and rash appears to be resolving on her neck, arms, and anterior thighs. Cardiopulmonary exam remains normal.  [RS]  Mon May 27, 2020  0030 Patient reevaluated with even further improvement in her rash, near completely resolved on her arms,  neck, shoulders, and anterior thighs. She is no longer experiencing itching or tight sensation in her throat. At this time do not feel any further observation is warranted given there is no administration of epinephrine and patient seems to be improving with steroids and antihistamine medications. [RS]    Clinical Course User Index [RS] Farrel Guimond, Eugene Gavia, PA-C   MDM Rules/Calculators/A&P                         30 year-old female who presents with concern for hives and tightness in her throat after unknowninggly beeing prescribed bactrim, and antibiotic to which she has a know allergy.  Vitals are normal on intake. Cardiopulmonary exam is normal, abdominal exam is benign. Oropharyngeal exam without lingual or sublingual or submental edema. No edema or crepitus of the neck, no change in the patient's voice. There is an urticarial rash on the arms, neck, trunk, and thighs with associated itching.   Differential diagnosis includes but is not limited to anaphylaxis / allergic reaction, angioedema, cold urticaria.   Patient's presentation most consistent with allergic reaction. Will administer solumedrol, benadryl, and pepcid for allergic reaction.   Patient observed for 2 hours after administration of antihistamines and steroid, with significant improvement. Please see ED course above for more detail. Giving reassuring cardiopulmonary exam, improving rash and symptomatology no further work up is warranted in the ED at this time. Very little concern for rebound reaction given no epinephrine was administered. No further work up or observation is warranted in the ED at this time. Will discharge with instructions to continue OTC antihistamines and will prescribe steroid burst.   Mahaila voiced understanding of her medical evaluation and treatment plan. Each of her questions was answered to her expressed satisfaction. Return precautions were given. Patient is  well appearing, stable, and appropriate for  discharge at this time.   This chart was dictated using voice recognition software, Dragon. Despite the best efforts of this provider to proofread and correct errors, errors may still occur which can change documentation meaning.   Final Clinical Impression(s) / ED Diagnoses Final diagnoses:  Allergic reaction, initial encounter    Rx / DC Orders ED Discharge Orders         Ordered    famotidine (PEPCID) 20 MG tablet  2 times daily        05/27/20 0029    predniSONE (DELTASONE) 50 MG tablet  Daily        05/27/20 0029           Sherrilee Gilles 05/28/20 2107    Gwyneth Sprout, MD 05/29/20 [phone]

## 2020-05-27 MED ORDER — PREDNISONE 50 MG PO TABS: 50.0000 mg | ORAL_TABLET | Freq: Every day | ORAL | 0 refills | Status: AC

## 2020-05-27 MED ORDER — FAMOTIDINE 20 MG PO TABS: 20.0000 mg | ORAL_TABLET | Freq: Two times a day (BID) | ORAL | 0 refills | Status: DC

## 2020-05-27 NOTE — Discharge Instructions (Addendum)
You were seen in the ED and found to have an allergic reaction, likely secondary to the bactrim you were prescribed last week.   You were administered Benadryl, Pepcid, and steroids in the emergency department with significant provement in your symptoms.  To prevent recurrence of your reaction he should take the following medications for the next few days: -Benadryl every 6 hours for the next 48 hours.  After that you should take Zyrtec or Claritin or Allegra daily for the next 10 days.  You may pick this up over-the-counter. If you are not willing to take Benadryl for the next 2 days you may take a double dose of Zyrtec or Claritin or allegra as an alternative and then begin a single dose after 48 hours. -Pepcid twice daily for the next 5 days (as prescribed your pharmacy) -As well as a steroid called prednisone which you should take daily for the next 5 days.  The Pepcid and prednisone have been prescribed to your pharmacy.  Return to emergency department if develop any new tightness or swelling in your throat, recurrent rash, difficulty breathing, nausea or vomiting that does not stop, or any other new severe symptoms.

## 2020-09-02 ENCOUNTER — Emergency Department (HOSPITAL_BASED_OUTPATIENT_CLINIC_OR_DEPARTMENT_OTHER): Payer: BC Managed Care – PPO

## 2020-09-02 ENCOUNTER — Other Ambulatory Visit: Payer: Self-pay

## 2020-09-02 ENCOUNTER — Emergency Department (HOSPITAL_BASED_OUTPATIENT_CLINIC_OR_DEPARTMENT_OTHER)
Admission: EM | Admit: 2020-09-02 | Discharge: 2020-09-03 | Disposition: A | Discharge status: Home / Self Care | Payer: BC Managed Care – PPO | Attending: Emergency Medicine | Admitting: Emergency Medicine

## 2020-09-02 ENCOUNTER — Encounter (HOSPITAL_BASED_OUTPATIENT_CLINIC_OR_DEPARTMENT_OTHER): Payer: Self-pay | Admitting: Emergency Medicine

## 2020-09-02 DIAGNOSIS — Z91040 Latex allergy status: Secondary | ICD-10-CM | POA: Diagnosis not present

## 2020-09-02 DIAGNOSIS — R002 Palpitations: Secondary | ICD-10-CM | POA: Insufficient documentation

## 2020-09-02 DIAGNOSIS — F17200 Nicotine dependence, unspecified, uncomplicated: Secondary | ICD-10-CM | POA: Insufficient documentation

## 2020-09-02 DIAGNOSIS — R072 Precordial pain: Secondary | ICD-10-CM | POA: Diagnosis not present

## 2020-09-02 DIAGNOSIS — R079 Chest pain, unspecified: Secondary | ICD-10-CM

## 2020-09-02 DIAGNOSIS — R42 Dizziness and giddiness: Secondary | ICD-10-CM | POA: Diagnosis not present

## 2020-09-02 DIAGNOSIS — R0602 Shortness of breath: Secondary | ICD-10-CM | POA: Insufficient documentation

## 2020-09-02 HISTORY — DX: Genetic susceptibility to malignant neoplasm of breast: Z15.01

## 2020-09-02 LAB — CBC
HCT: 40.9 % (ref 36.0–46.0)
Hemoglobin: 13.8 g/dL (ref 12.0–15.0)
MCH: 30.3 pg (ref 26.0–34.0)
MCHC: 33.7 g/dL (ref 30.0–36.0)
MCV: 89.7 fL (ref 80.0–100.0)
Platelets: 193 10*3/uL (ref 150–400)
RBC: 4.56 MIL/uL (ref 3.87–5.11)
RDW: 12.4 % (ref 11.5–15.5)
WBC: 9.6 10*3/uL (ref 4.0–10.5)
nRBC: 0 % (ref 0.0–0.2)

## 2020-09-02 LAB — BASIC METABOLIC PANEL
Anion gap: 10 (ref 5–15)
BUN: 11 mg/dL (ref 6–20)
CO2: 24 mmol/L (ref 22–32)
Calcium: 8.8 mg/dL — ABNORMAL LOW (ref 8.9–10.3)
Chloride: 105 mmol/L (ref 98–111)
Creatinine, Ser: 0.83 mg/dL (ref 0.44–1.00)
GFR, Estimated: >60 mL/min (ref >60)
Glucose, Bld: 115 mg/dL — ABNORMAL HIGH (ref 70–99)
Potassium: 3.5 mmol/L (ref 3.5–5.1)
Sodium: 139 mmol/L (ref 135–145)

## 2020-09-02 LAB — TROPONIN I (HIGH SENSITIVITY): Troponin I (High Sensitivity): <2 ng/L (ref <18)

## 2020-09-02 NOTE — ED Triage Notes (Signed)
Patient presents with complaints of chest pressure onset 20 minutes ago; states woke up this am lightheaded and having palpitations. States intermittent nausea. Denies vomiting. States shortness of breath. States started zpak 3 days ago; also taking med for anxiety.

## 2020-09-03 LAB — D-DIMER, QUANTITATIVE: D-Dimer, Quant: <0.27 ug/mL-FEU (ref 0.00–0.50)

## 2020-09-03 LAB — TROPONIN I (HIGH SENSITIVITY): Troponin I (High Sensitivity): <2 ng/L (ref <18)

## 2020-09-03 NOTE — ED Provider Notes (Signed)
[facility]PARTMENT Provider Note   CSN: [id number] Arrival date & time: 09/02/20  2107     History Chief Complaint  Patient presents with  . Chest Pain    Sheri Murray is a 30 y.o. female.  30 yo F with a cc of palpitations lightheadedness and chest pressure.  This been going on for a couple days but worsening about 4 or 5 hours ago.  The patient recently had a dental extraction and is on azithromycin.  She looked up drug drug interactions and was concerned that it may be interacting with one of her anxiety medications.  She decided then to come to the ED for evaluation.  Denies cough congestion or fever.  Feels like her dental extraction is healing well.  She denies any abdominal pain nausea vomiting or diarrhea.  Symptoms tend to get worse when she gets up to ambulate.  She is on estrogen for birth control.  Is borderline hypertensive borderline cholesterol level borderline diabetes not on medicine for any.  Pack and a half smoker.  Father had an MI.  Denies history of PE or DVT denies hemoptysis denies unilateral lower extremity edema.  The history is provided by the patient.  Chest Pain Pain location:  Substernal area Pain quality: pressure   Pain radiates to:  Does not radiate Pain severity:  Moderate Onset quality:  Gradual Duration:  2 days Timing:  Constant Progression:  Worsening Chronicity:  New Relieved by:  Nothing Worsened by:  Nothing Ineffective treatments:  None tried Associated symptoms: palpitations and shortness of breath   Associated symptoms: no dizziness, no fever, no headache, no nausea and no vomiting        Past Medical History:  Diagnosis Date  . BRCA1 positive   . Endometriosis     There are no problems to display for this patient.   Past Surgical History:  Procedure Laterality Date  . BREAST SURGERY     lumpectomy  . TONSILLECTOMY       OB History   No obstetric history on file.     Family History   Problem Relation Age of Onset  . Cancer Mother   . Osteoarthritis Mother   . Diabetes Father     Social History   Tobacco Use  . Smoking status: Current Every Day Smoker  . Smokeless tobacco: Never Used  Vaping Use  . Vaping Use: Never used  Substance Use Topics  . Alcohol use: Not Currently  . Drug use: No    Home Medications Prior to Admission medications   Medication Sig Start Date End Date Taking? Authorizing Provider  desogestrel-ethinyl estradiol (MIRCETTE) 0.15-0.02/0.01 MG (21/5) tablet Take by mouth. 09/16/18   [provider]  famotidine (PEPCID) 20 MG tablet Take 1 tablet (20 mg total) by mouth 2 (two) times daily for 5 days. 05/27/20 06/01/20  Sponseller, Gypsy Balsam, PA-C  methocarbamol (ROBAXIN) 500 MG tablet Take 1 tablet (500 mg total) by mouth at bedtime as needed for muscle spasms. 12/25/17   Caccavale, Sophia, PA-C  traMADol (ULTRAM) 50 MG tablet Take 1 tablet (50 mg total) by mouth every 6 (six) hours as needed. 05/19/17   Fisher, Linden Dolin, PA-C    Allergies    Ivp dye [iodinated diagnostic agents], Latex, Bactrim [sulfamethoxazole-trimethoprim], and Penicillins  Review of Systems   Review of Systems  Constitutional: Negative for chills and fever.  HENT: Negative for congestion and rhinorrhea.   Eyes: Negative for redness and visual disturbance.  Respiratory:  Positive for shortness of breath. Negative for wheezing.   Cardiovascular: Positive for chest pain and palpitations.  Gastrointestinal: Negative for nausea and vomiting.  Genitourinary: Negative for dysuria and urgency.  Musculoskeletal: Negative for arthralgias and myalgias.  Skin: Negative for pallor and wound.  Neurological: Positive for light-headedness. Negative for dizziness and headaches.    Physical Exam Updated Vital Signs BP 123/66   Pulse 63   Temp 98.2 F (36.8 C) (Oral)   Resp (!) 21   Ht _0  (1.651 m)   Wt 77.1 kg   LMP 08/05/2020 Comment: on birth control  SpO2 97%    BMI 28.29 kg/m   Physical Exam Vitals and nursing note reviewed.  Constitutional:      General: She is not in acute distress.    Appearance: She is well-developed. She is not diaphoretic.  HENT:     Head: Normocephalic and atraumatic.  Eyes:     Pupils: Pupils are equal, round, and reactive to light.  Cardiovascular:     Rate and Rhythm: Normal rate and regular rhythm.     Heart sounds: No murmur heard. No friction rub. No gallop.   Pulmonary:     Effort: Pulmonary effort is normal.     Breath sounds: No wheezing or rales.  Abdominal:     General: There is no distension.     Palpations: Abdomen is soft.     Tenderness: There is abdominal tenderness (mild lower abdominal tenderness, chronic per patient).  Musculoskeletal:        General: No tenderness.     Cervical back: Normal range of motion and neck supple.  Skin:    General: Skin is warm and dry.  Neurological:     Mental Status: She is alert and oriented to person, place, and time.  Psychiatric:        Behavior: Behavior normal.     ED Results / Procedures / Treatments   Labs (all labs ordered are listed, but only abnormal results are displayed) Labs Reviewed  BASIC METABOLIC PANEL - Abnormal; Notable for the following components:      Result Value   Glucose, Bld 115 (*)    Calcium 8.8 (*)    All other components within normal limits  CBC  D-DIMER, QUANTITATIVE  PREGNANCY, URINE  TROPONIN I (HIGH SENSITIVITY)  TROPONIN I (HIGH SENSITIVITY)    EKG None  Radiology DG Chest 2 View  Result Date: 09/02/2020 CLINICAL DATA:  Chest pressure, palpitations, nausea EXAM: CHEST - 2 VIEW COMPARISON:  None. FINDINGS: Frontal and lateral views of the chest demonstrate an unremarkable cardiac silhouette. No acute airspace disease, effusion, or pneumothorax. No acute bony abnormalities. IMPRESSION: 1. No acute intrathoracic process. Electronically Signed   By: Randa Ngo M.D.   On: 09/02/2020 21:48     Procedures Procedures   Medications Ordered in ED Medications - No data to display  ED Course  I have reviewed the triage vital signs and the nursing notes.  Pertinent labs & imaging results that were available during my care of the patient were reviewed by me and considered in my medical decision making (see chart for details).    MDM Rules/Calculators/A&P                          29 yo F with a chief complaints of chest pain palpitations and lightheadedness.  Going on for the past couple days.  She had a recent dental extraction and  is on estrogen and smokes I feel compelled to order a D-dimer.  Troponin negative no significant electrolyte issue chest x-ray viewed by me without focal infiltrate or pneumothorax.  D-dimer negative.  Delta Trope negative.  Discharge home.  PCP follow-up.    I have discussed the diagnosis/risks/treatment options with the patient and family and believe the pt to be eligible for discharge home to follow-up with PCP. We also discussed returning to the ED immediately if new or worsening sx occur. We discussed the sx which are most concerning (e.g., sudden worsening pain, fever, inability to tolerate by mouth) that necessitate immediate return. Medications administered to the patient during their visit and any new prescriptions provided to the patient are listed below.  Medications given during this visit Medications - No data to display   The patient appears reasonably screen and/or stabilized for discharge and I doubt any other medical condition or other [facility] requiring further screening, evaluation, or treatment in the ED at this time prior to discharge.   Final Clinical Impression(s) / ED Diagnoses Final diagnoses:  Nonspecific chest pain    Rx / DC Orders ED Discharge Orders    None       Deno Etienne, DO 09/03/20 0219

## 2020-09-03 NOTE — ED Notes (Signed)
Discharge instructions discussed with pt. Pt verbalized understanding. Pt stable and ambulatory. No signature pad available. 

## 2020-09-03 NOTE — Discharge Instructions (Signed)
Stop your azithromycin.  Follow up with your family doc.

## 2020-09-10 ENCOUNTER — Other Ambulatory Visit: Payer: Self-pay | Admitting: Obstetrics and Gynecology

## 2020-11-19 NOTE — H&P (Signed)
Sheri Murray is a 30 y.o. female here for LAVH BSO and excision of endometriosis Pt referred to me by Dr Ward for continuation of discussion of Brca1 defect , endometriosis a, pelvic pain , dyspareunia . She states her mother was diagnosed with breast cancer2008 and patient was tested for BrCA1 +. She has been on OCP to control endometriosis which was found at time of her L/S surgery 12 yrs ago for a migrated intraadbominal IUD( no records) . She states that she has lower pelvic pain which worsens before cycle . Increasing dyspareunia deep thrust for the last yr .  G3P1 svdx1.   She has been undergoing breast exam , mammography x1 and MRI ( but has an allergy to Gadolinium dye)   She states she is complete child bearing and would like to pe    Past Medical History:  has a past medical history of BRCA1 gene mutation positive, Breast lump, Eczema, unspecified, Endometriosis, Menstrual problem, unspecified, and Migraine, unspecified, intractable, with status migrainosus.  Past Surgical History:  has a past surgical history that includes IUD Removal and Lumpectomy left breast. Family History: family history includes Alcohol abuse in her father, paternal grandfather, and paternal grandmother; Alzheimer's disease in her maternal grandmother; BRCA1 Positive in her sister; Breast cancer in her mother; Dementia in her maternal grandmother; Diabetes type II in her father and paternal grandmother; ESRD-Dialysis in her paternal grandmother; High blood pressure (Hypertension) in her father, maternal grandfather, and mother; Hyperlipidemia (Elevated cholesterol) in her father and maternal grandfather; Kidney disease in her maternal grandfather and paternal grandmother; Lung cancer in her paternal grandfather; Myocardial Infarction (Heart attack) (age of onset: 54) in her father; No Known Problems in her daughter; Osteoporosis (Thinning of bones) in her mother; Skin cancer in her maternal grandfather; Thyroid disease in her  maternal grandmother, mother, and paternal grandmother. Social History:  reports that she has been smoking cigarettes. She has been smoking about 0.50 packs per day. She has never used smokeless tobacco. She reports current alcohol use. She reports that she does not use drugs. OB/GYN History:          OB History     Gravida  1   Para  1   Term  1   Preterm      AB      Living  1      SAB      IAB      Ectopic      Molar      Multiple      Live Births  1             Allergies: is allergic to bactrim [sulfamethoxazole-trimethoprim], iodine, gadolinium-containing contrast media, amoxicillin, latex, and pineapple. Medications:   Current Outpatient Medications:    citalopram (CELEXA) 20 MG tablet, Take 1 tablet (20 mg total) by mouth once daily, Disp: 90 tablet, Rfl: 3   desog-e.estradiol/e.estradiol biphasic (VIORELE, 28,) 0.15-0.02 mgx21 /0.01 mg x 5 tablet, Take 1 tablet by mouth once daily, Disp: 84 tablet, Rfl: 3   ibuprofen (MOTRIN) 600 MG tablet, , Disp: , Rfl:    propranoloL (INDERAL) 20 MG tablet, TAKE 1 TABLET(20 MG) BY MOUTH TWICE DAILY AS NEEDED, Disp: 180 tablet, Rfl: 0   famotidine (PEPCID) 20 MG tablet, , Disp: , Rfl:    multivitamin tablet, Take 1 tablet by mouth once daily (Patient not taking: Reported on 09/05/2020), Disp: , Rfl:    Review of Systems: General:                        No fatigue or weight loss Eyes:                           No vision changes Ears:                            No hearing difficulty Respiratory:                No cough or shortness of breath Pulmonary:                  No asthma or shortness of breath Cardiovascular:           No chest pain, palpitations, dyspnea on exertion Gastrointestinal:          No abdominal bloating, chronic diarrhea, constipations, masses, pain or hematochezia Genitourinary:             No hematuria, dysuria, abnormal vaginal discharge, pelvic pain, Menometrorrhagia, + dyspareunia  Lymphatic:                    No swollen lymph nodes Musculoskeletal:         No muscle weakness Neurologic:                  No extremity weakness, syncope, seizure disorder Psychiatric:                  No history of depression, delusions or suicidal/homicidal ideation      Exam:       Vitals:  11/19/20   BP: 124/68  Pulse: 79      Body mass index is 29.1 kg/m.   WDWN white/  female in NAD   Lungs: CTA  CV : RRR without murmur   Neck:  no thyromegaly Abdomen: soft , no mass, normal active bowel sounds,  non-tender, no rebound tenderness Pelvic: tanner stage 5 ,  External genitalia: vulva /labia no lesions Urethra: no prolapse Vagina: normal physiologic d/c adequate room for LAVH  If need be  Cervix: no lesions, no cervical motion tenderness   Uterus: normal size shape and contour, non-tender Adnexa: no mass,  non-tender   Rectovaginal:   Impression:    The primary encounter diagnosis was BRCA1 positive. Diagnoses of Endometriosis of pelvis, Dyspareunia, female, and Encounter for cervical Pap smear with pelvic exam were also pertinent to this visit.       Plan:  A detailed review of the risks of developing either or  Both breast and ovarian/ fallopian/ peritoneal  Or less likely pacreatic , melanoma , uterine cancers with BrCA 1 defect . Pt wishes to pursue  Hysterectomy with removal of tubes / ovaries . She is aware that vasomotor symptom that will develop after . Role of HRT / OCPs discussed and how that will affect her endometriosis. Pain relief post surgery may be varied . Sex drive may be altered . Osteoporosis may develop earlier after surgical removal of ovaries . . The procedure offered is LAVH and BSO , possible excision of endometriosis Benefits and risks to surgery: The proposed benefit of the surgery has been discussed with the patient. The possible risks include, but are not limited to: organ injury to the bowel , bladder, ureters, and major blood vessels and nerves. There is a  possibility of additional surgeries resulting from these injuries. There is also the risk of blood transfusion and the need to receive blood products   during or after the procedure which may rarely lead to HIV or Hepatitis C infection. There is a risk of developing a deep venous thrombosis or a pulmonary embolism . There is the possibility of wound infection and also anesthetic complications, even the rare possibility of death. The patient understands these risks and wishes to proceed. All questions have been answered and the consent has been signed.         THOMAS JANSE SCHERMERHORN, MD   

## 2020-11-25 ENCOUNTER — Encounter: Payer: Self-pay | Admitting: Urgent Care

## 2020-11-25 ENCOUNTER — Encounter
Admission: RE | Admit: 2020-11-25 | Discharge: 2020-11-25 | Disposition: A | Discharge status: Home / Self Care | Payer: BC Managed Care – PPO | Source: Ambulatory Visit | Attending: Obstetrics and Gynecology | Admitting: Obstetrics and Gynecology

## 2020-11-25 ENCOUNTER — Other Ambulatory Visit: Payer: Self-pay

## 2020-11-25 DIAGNOSIS — Z01812 Encounter for preprocedural laboratory examination: Secondary | ICD-10-CM | POA: Insufficient documentation

## 2020-11-25 HISTORY — DX: Dermatitis, unspecified: L30.9

## 2020-11-25 HISTORY — DX: Nausea with vomiting, unspecified: R11.2

## 2020-11-25 HISTORY — DX: Anxiety disorder, unspecified: F41.9

## 2020-11-25 HISTORY — DX: Other specified postprocedural states: Z98.890

## 2020-11-25 LAB — BASIC METABOLIC PANEL
Anion gap: 7 (ref 5–15)
BUN: 12 mg/dL (ref 6–20)
CO2: 24 mmol/L (ref 22–32)
Calcium: 9.4 mg/dL (ref 8.9–10.3)
Chloride: 106 mmol/L (ref 98–111)
Creatinine, Ser: 0.67 mg/dL (ref 0.44–1.00)
GFR, Estimated: >60 mL/min (ref >60)
Glucose, Bld: 101 mg/dL — ABNORMAL HIGH (ref 70–99)
Potassium: 4.1 mmol/L (ref 3.5–5.1)
Sodium: 137 mmol/L (ref 135–145)

## 2020-11-25 LAB — CBC
HCT: 40.6 % (ref 36.0–46.0)
Hemoglobin: 13.9 g/dL (ref 12.0–15.0)
MCH: 31 pg (ref 26.0–34.0)
MCHC: 34.2 g/dL (ref 30.0–36.0)
MCV: 90.6 fL (ref 80.0–100.0)
Platelets: 191 10*3/uL (ref 150–400)
RBC: 4.48 MIL/uL (ref 3.87–5.11)
RDW: 12.7 % (ref 11.5–15.5)
WBC: 7.9 10*3/uL (ref 4.0–10.5)
nRBC: 0 % (ref 0.0–0.2)

## 2020-11-25 LAB — TYPE AND SCREEN
ABO/RH(D): O NEG
Antibody Screen: NEGATIVE

## 2020-11-25 NOTE — Patient Instructions (Addendum)
Your procedure is scheduled on:  November 29, 2020 Friday  Report to the Registration Desk on the 1st floor of the [facility]. To find out your arrival time, please call [phone] between 1PM - 3PM on: Thursday November 28, 2020 THURSDAY  REMEMBER: Instructions that are not followed completely may result in serious medical risk, up to and including death; or upon the discretion of your surgeon and anesthesiologist your surgery may need to be rescheduled.  Do not eat food after midnight the night before surgery.  No gum chewing, lozengers or hard candies.  You may however, drink CLEAR liquids up to 2 hours before you are scheduled to arrive for your surgery. Do not drink anything within 2 hours of your scheduled arrival time.  Clear liquids include: - water  - apple juice without pulp - gatorade (not RED, PURPLE, OR BLUE) - black coffee or tea (Do NOT add milk or creamers to the coffee or tea) Do NOT drink anything that is not on this list.  Type 1 and Type 2 diabetics should only drink water.  In addition, your doctor has ordered for you to drink the provided  PRE SURGERY ENSURE DRINK  Drinking this carbohydrate drink up to two hours before surgery helps to reduce insulin resistance and improve patient outcomes. Please complete drinking 2 hours prior to scheduled arrival time.  TAKE THESE MEDICATIONS THE MORNING OF SURGERY WITH A SIP OF WATER: NONE  One week prior to surgery: Stop Anti-inflammatories (NSAIDS) such as Advil, Aleve, Ibuprofen, Motrin, Naproxen, Naprosyn and ASPIRIN OR Aspirin based products such as Excedrin, Goodys Powder, BC Powder. Stop ANY OVER THE COUNTER supplements until after surgery. You may however, continue to take Tylenol if needed for pain up until the day of surgery.  No Alcohol for 24 hours before or after surgery.  No Smoking including e-cigarettes for 24 hours prior to surgery.  No chewable tobacco products for at least 6 hours prior to surgery.   No nicotine patches on the day of surgery.  Do not use any "recreational" drugs for at least a week prior to your surgery.  Please be advised that the combination of cocaine and anesthesia may have negative outcomes, up to and including death. If you test positive for cocaine, your surgery will be cancelled.  On the morning of surgery brush your teeth with toothpaste and water, you may rinse your mouth with mouthwash if you wish. Do not swallow any toothpaste or mouthwash.  Do not wear jewelry, make-up, hairpins, clips or nail polish.  Do not wear lotions, powders, or perfumes OR DEODORANT   Do not shave body from the neck down 48 hours prior to surgery just in case you cut yourself which could leave a site for infection.  Also, freshly shaved skin may become irritated if using the CHG soap.  Contact lenses, hearing aids and dentures may not be worn into surgery.  Do not bring valuables to the hospital. [facility] is not responsible for any missing/lost belongings or valuables.   Use CHG Soap as directed on instruction sheet..  Notify your doctor if there is any change in your medical condition (cold, fever, infection).  Wear comfortable clothing (specific to your surgery type) to the hospital.  After surgery, you can help prevent lung complications by doing breathing exercises.  Take deep breaths and cough every 1-2 hours. Your doctor may order a device called an Incentive Spirometer to help you take deep breaths. When coughing or sneezing, hold  a pillow firmly against your incision with both hands. This is called "splinting." Doing this helps protect your incision. It also decreases belly discomfort.  If you are being discharged the day of surgery, you will not be allowed to drive home. You will need a responsible adult (18 years or older) to drive you home and stay with you that night.   If you are taking public transportation, you will need to have a responsible adult (18  years or older) with you. Please confirm with your physician that it is acceptable to use public transportation.   Please call the Pre-admissions Testing Dept. at [phone] if you have any questions about these instructions.  Surgery Visitation Policy:  Patients undergoing a surgery or procedure may have one family member or support person with them as long as that person is not COVID-19 positive or experiencing its symptoms.  That person may remain in the waiting area during the procedure.  Inpatient Visitation:    Visiting hours are 7 a.m. to 8 p.m. Inpatients will be allowed two visitors daily. The visitors may change each day during the patient's stay. No visitors under the age of 52. Any visitor under the age of 27 must be accompanied by an adult. The visitor must pass COVID-19 screenings, use hand sanitizer when entering and exiting the patient's room and wear a mask at all times, including in the patient's room. Patients must also wear a mask when staff or their visitor are in the room. Masking is required regardless of vaccination status.

## 2020-11-29 ENCOUNTER — Encounter: Payer: Self-pay | Admitting: Obstetrics and Gynecology

## 2020-11-29 ENCOUNTER — Ambulatory Visit
Admission: RE | Admit: 2020-11-29 | Discharge: 2020-11-29 | Disposition: A | Discharge status: Home / Self Care | Payer: BC Managed Care – PPO | Attending: Obstetrics and Gynecology | Admitting: Obstetrics and Gynecology

## 2020-11-29 ENCOUNTER — Ambulatory Visit: Payer: BC Managed Care – PPO | Admitting: Certified Registered"

## 2020-11-29 ENCOUNTER — Encounter
Admission: RE | Disposition: A | Discharge status: Home / Self Care | Payer: Self-pay | Source: Home / Self Care | Attending: Obstetrics and Gynecology

## 2020-11-29 DIAGNOSIS — Z8349 Family history of other endocrine, nutritional and metabolic diseases: Secondary | ICD-10-CM | POA: Diagnosis not present

## 2020-11-29 DIAGNOSIS — F17210 Nicotine dependence, cigarettes, uncomplicated: Secondary | ICD-10-CM | POA: Diagnosis not present

## 2020-11-29 DIAGNOSIS — Z791 Long term (current) use of non-steroidal anti-inflammatories (NSAID): Secondary | ICD-10-CM | POA: Diagnosis not present

## 2020-11-29 DIAGNOSIS — N800 Endometriosis of uterus: Secondary | ICD-10-CM | POA: Diagnosis not present

## 2020-11-29 DIAGNOSIS — Z888 Allergy status to other drugs, medicaments and biological substances status: Secondary | ICD-10-CM | POA: Insufficient documentation

## 2020-11-29 DIAGNOSIS — Z79899 Other long term (current) drug therapy: Secondary | ICD-10-CM | POA: Diagnosis not present

## 2020-11-29 DIAGNOSIS — Z801 Family history of malignant neoplasm of trachea, bronchus and lung: Secondary | ICD-10-CM | POA: Diagnosis not present

## 2020-11-29 DIAGNOSIS — Z881 Allergy status to other antibiotic agents status: Secondary | ICD-10-CM | POA: Insufficient documentation

## 2020-11-29 DIAGNOSIS — Z79890 Hormone replacement therapy (postmenopausal): Secondary | ICD-10-CM | POA: Insufficient documentation

## 2020-11-29 DIAGNOSIS — R102 Pelvic and perineal pain: Secondary | ICD-10-CM | POA: Insufficient documentation

## 2020-11-29 DIAGNOSIS — N803 Endometriosis of pelvic peritoneum: Secondary | ICD-10-CM | POA: Diagnosis not present

## 2020-11-29 DIAGNOSIS — Z833 Family history of diabetes mellitus: Secondary | ICD-10-CM | POA: Insufficient documentation

## 2020-11-29 DIAGNOSIS — Z1509 Genetic susceptibility to other malignant neoplasm: Secondary | ICD-10-CM | POA: Diagnosis not present

## 2020-11-29 DIAGNOSIS — Z8262 Family history of osteoporosis: Secondary | ICD-10-CM | POA: Diagnosis not present

## 2020-11-29 DIAGNOSIS — N941 Unspecified dyspareunia: Secondary | ICD-10-CM | POA: Insufficient documentation

## 2020-11-29 DIAGNOSIS — G8929 Other chronic pain: Secondary | ICD-10-CM | POA: Insufficient documentation

## 2020-11-29 DIAGNOSIS — Z8249 Family history of ischemic heart disease and other diseases of the circulatory system: Secondary | ICD-10-CM | POA: Diagnosis not present

## 2020-11-29 DIAGNOSIS — Z880 Allergy status to penicillin: Secondary | ICD-10-CM | POA: Diagnosis not present

## 2020-11-29 DIAGNOSIS — Z803 Family history of malignant neoplasm of breast: Secondary | ICD-10-CM | POA: Diagnosis not present

## 2020-11-29 HISTORY — PX: LAPAROSCOPIC VAGINAL HYSTERECTOMY WITH SALPINGO OOPHORECTOMY: SHX6681

## 2020-11-29 LAB — ABO/RH: ABO/RH(D): O NEG

## 2020-11-29 LAB — POCT PREGNANCY, URINE: Preg Test, Ur: NEGATIVE

## 2020-11-29 SURGERY — HYSTERECTOMY, VAGINAL, LAPAROSCOPY-ASSISTED, WITH SALPINGO-OOPHORECTOMY
Anesthesia: General | Laterality: Bilateral

## 2020-11-29 MED ORDER — BUPIVACAINE HCL 0.5 % IJ SOLN
INTRAMUSCULAR | Status: DC | PRN
  Administered 2020-11-29: 13 mL

## 2020-11-29 MED ORDER — PROMETHAZINE HCL 25 MG/ML IJ SOLN: 6.2500 mg | INTRAMUSCULAR | Status: DC | PRN

## 2020-11-29 MED ORDER — OXYCODONE-ACETAMINOPHEN 5-325 MG PO TABS
ORAL_TABLET | ORAL | Status: AC
  Filled 2020-11-29: qty 1

## 2020-11-29 MED ORDER — EPHEDRINE SULFATE 50 MG/ML IJ SOLN
INTRAMUSCULAR | Status: DC | PRN
  Administered 2020-11-29: 10 mg via INTRAVENOUS

## 2020-11-29 MED ORDER — ONDANSETRON HCL 4 MG/2ML IJ SOLN
INTRAMUSCULAR | Status: DC | PRN
  Administered 2020-11-29: 4 mg via INTRAVENOUS

## 2020-11-29 MED ORDER — DEXMEDETOMIDINE (PRECEDEX) IN NS 20 MCG/5ML (4 MCG/ML) IV SYRINGE
PREFILLED_SYRINGE | INTRAVENOUS | Status: AC
  Filled 2020-11-29: qty 5

## 2020-11-29 MED ORDER — ONDANSETRON HCL 4 MG/2ML IJ SOLN
INTRAMUSCULAR | Status: AC
  Filled 2020-11-29: qty 2

## 2020-11-29 MED ORDER — CEFAZOLIN SODIUM-DEXTROSE 2-4 GM/100ML-% IV SOLN
2.0000 g | Freq: Once | INTRAVENOUS | Status: AC
  Administered 2020-11-29: 2 g via INTRAVENOUS

## 2020-11-29 MED ORDER — MIDAZOLAM HCL 2 MG/2ML IJ SOLN
INTRAMUSCULAR | Status: DC | PRN
  Administered 2020-11-29: 2 mg via INTRAVENOUS

## 2020-11-29 MED ORDER — FENTANYL CITRATE (PF) 100 MCG/2ML IJ SOLN
25.0000 ug | INTRAMUSCULAR | Status: DC | PRN
  Administered 2020-11-29 (×3): 25 ug via INTRAVENOUS

## 2020-11-29 MED ORDER — GABAPENTIN 300 MG PO CAPS: 300.0000 mg | ORAL_CAPSULE | ORAL | Status: AC

## 2020-11-29 MED ORDER — BUPIVACAINE HCL (PF) 0.5 % IJ SOLN
INTRAMUSCULAR | Status: AC
  Filled 2020-11-29: qty 30

## 2020-11-29 MED ORDER — SUGAMMADEX SODIUM 200 MG/2ML IV SOLN
INTRAVENOUS | Status: DC | PRN
  Administered 2020-11-29: 200 mg via INTRAVENOUS

## 2020-11-29 MED ORDER — ACETAMINOPHEN 500 MG PO TABS
ORAL_TABLET | ORAL | Status: AC
  Administered 2020-11-29: 1000 mg via ORAL
  Filled 2020-11-29: qty 2

## 2020-11-29 MED ORDER — APREPITANT 40 MG PO CAPS: 40.0000 mg | ORAL_CAPSULE | Freq: Once | ORAL | Status: AC

## 2020-11-29 MED ORDER — MIDAZOLAM HCL 2 MG/2ML IJ SOLN
INTRAMUSCULAR | Status: AC
  Filled 2020-11-29: qty 2

## 2020-11-29 MED ORDER — FENTANYL CITRATE (PF) 250 MCG/5ML IJ SOLN
INTRAMUSCULAR | Status: AC
  Filled 2020-11-29: qty 5

## 2020-11-29 MED ORDER — LIDOCAINE-EPINEPHRINE 1 %-1:100000 IJ SOLN
INTRAMUSCULAR | Status: DC | PRN
  Administered 2020-11-29: 7 mL

## 2020-11-29 MED ORDER — 0.9 % SODIUM CHLORIDE (POUR BTL) OPTIME
TOPICAL | Status: DC | PRN
  Administered 2020-11-29: 150 mL

## 2020-11-29 MED ORDER — DEXAMETHASONE SODIUM PHOSPHATE 10 MG/ML IJ SOLN
INTRAMUSCULAR | Status: AC
  Filled 2020-11-29: qty 1

## 2020-11-29 MED ORDER — CHLORHEXIDINE GLUCONATE 0.12 % MT SOLN
OROMUCOSAL | Status: AC
  Administered 2020-11-29: 15 mL via OROMUCOSAL
  Filled 2020-11-29: qty 15

## 2020-11-29 MED ORDER — LIDOCAINE-EPINEPHRINE 1 %-1:100000 IJ SOLN
INTRAMUSCULAR | Status: AC
  Filled 2020-11-29: qty 1

## 2020-11-29 MED ORDER — DEXMEDETOMIDINE (PRECEDEX) IN NS 20 MCG/5ML (4 MCG/ML) IV SYRINGE
PREFILLED_SYRINGE | INTRAVENOUS | Status: DC | PRN
  Administered 2020-11-29: 12 ug via INTRAVENOUS
  Administered 2020-11-29: 8 ug via INTRAVENOUS

## 2020-11-29 MED ORDER — ROCURONIUM BROMIDE 10 MG/ML (PF) SYRINGE
PREFILLED_SYRINGE | INTRAVENOUS | Status: AC
  Filled 2020-11-29: qty 10

## 2020-11-29 MED ORDER — KETOROLAC TROMETHAMINE 30 MG/ML IJ SOLN
INTRAMUSCULAR | Status: AC
  Filled 2020-11-29: qty 1

## 2020-11-29 MED ORDER — POVIDONE-IODINE 10 % EX SWAB: 2.0000 "application " | Freq: Once | CUTANEOUS | Status: DC

## 2020-11-29 MED ORDER — PROPOFOL 10 MG/ML IV BOLUS
INTRAVENOUS | Status: AC
  Filled 2020-11-29: qty 40

## 2020-11-29 MED ORDER — FENTANYL CITRATE (PF) 100 MCG/2ML IJ SOLN
INTRAMUSCULAR | Status: DC | PRN
  Administered 2020-11-29: 50 ug via INTRAVENOUS
  Administered 2020-11-29: 100 ug via INTRAVENOUS

## 2020-11-29 MED ORDER — DEXAMETHASONE SODIUM PHOSPHATE 10 MG/ML IJ SOLN
INTRAMUSCULAR | Status: DC | PRN
  Administered 2020-11-29: 10 mg via INTRAVENOUS

## 2020-11-29 MED ORDER — LACTATED RINGERS IV SOLN: INTRAVENOUS | Status: DC

## 2020-11-29 MED ORDER — OXYCODONE-ACETAMINOPHEN 5-325 MG PO TABS
1.0000 | ORAL_TABLET | ORAL | Status: DC | PRN
Start: 2020-11-29 — End: 2020-11-29
  Administered 2020-11-29: 1 via ORAL

## 2020-11-29 MED ORDER — FAMOTIDINE 20 MG PO TABS: 20.0000 mg | ORAL_TABLET | Freq: Once | ORAL | Status: AC

## 2020-11-29 MED ORDER — PROPOFOL 10 MG/ML IV BOLUS
INTRAVENOUS | Status: DC | PRN
  Administered 2020-11-29: 170 mg via INTRAVENOUS

## 2020-11-29 MED ORDER — PHENYLEPHRINE HCL (PRESSORS) 10 MG/ML IV SOLN
INTRAVENOUS | Status: AC
  Filled 2020-11-29: qty 1

## 2020-11-29 MED ORDER — PHENYLEPHRINE HCL (PRESSORS) 10 MG/ML IV SOLN
INTRAVENOUS | Status: DC | PRN
  Administered 2020-11-29: 150 ug via INTRAVENOUS

## 2020-11-29 MED ORDER — ROCURONIUM BROMIDE 100 MG/10ML IV SOLN
INTRAVENOUS | Status: DC | PRN
  Administered 2020-11-29: 40 mg via INTRAVENOUS
  Administered 2020-11-29: 60 mg via INTRAVENOUS

## 2020-11-29 MED ORDER — FENTANYL CITRATE (PF) 100 MCG/2ML IJ SOLN
INTRAMUSCULAR | Status: AC
  Administered 2020-11-29: 25 ug via INTRAVENOUS
  Filled 2020-11-29: qty 2

## 2020-11-29 MED ORDER — APREPITANT 40 MG PO CAPS
ORAL_CAPSULE | ORAL | Status: AC
  Administered 2020-11-29: 40 mg via ORAL
  Filled 2020-11-29: qty 1

## 2020-11-29 MED ORDER — ONDANSETRON 4 MG PO TBDP: 4.0000 mg | ORAL_TABLET | Freq: Four times a day (QID) | ORAL | Status: DC | PRN

## 2020-11-29 MED ORDER — PROPOFOL 10 MG/ML IV BOLUS
INTRAVENOUS | Status: AC
  Filled 2020-11-29: qty 20

## 2020-11-29 MED ORDER — KETOROLAC TROMETHAMINE 30 MG/ML IJ SOLN
INTRAMUSCULAR | Status: DC | PRN
  Administered 2020-11-29: 30 mg via INTRAVENOUS

## 2020-11-29 MED ORDER — LIDOCAINE HCL (CARDIAC) PF 100 MG/5ML IV SOSY
PREFILLED_SYRINGE | INTRAVENOUS | Status: DC | PRN
  Administered 2020-11-29: 100 mg via INTRAVENOUS

## 2020-11-29 MED ORDER — FAMOTIDINE 20 MG PO TABS
ORAL_TABLET | ORAL | Status: AC
  Administered 2020-11-29: 20 mg via ORAL
  Filled 2020-11-29: qty 1

## 2020-11-29 MED ORDER — OXYCODONE HCL 5 MG PO TABS
ORAL_TABLET | ORAL | Status: AC
  Filled 2020-11-29: qty 1

## 2020-11-29 MED ORDER — CHLORHEXIDINE GLUCONATE 0.12 % MT SOLN: 15.0000 mL | Freq: Once | OROMUCOSAL | Status: AC

## 2020-11-29 MED ORDER — ORAL CARE MOUTH RINSE: 15.0000 mL | Freq: Once | OROMUCOSAL | Status: AC

## 2020-11-29 MED ORDER — GABAPENTIN 300 MG PO CAPS
ORAL_CAPSULE | ORAL | Status: AC
  Administered 2020-11-29: 300 mg via ORAL
  Filled 2020-11-29: qty 1

## 2020-11-29 MED ORDER — CEFAZOLIN SODIUM-DEXTROSE 2-4 GM/100ML-% IV SOLN
INTRAVENOUS | Status: AC
  Filled 2020-11-29: qty 100

## 2020-11-29 MED ORDER — ACETAMINOPHEN 500 MG PO TABS: 1000.0000 mg | ORAL_TABLET | ORAL | Status: AC

## 2020-11-29 SURGICAL SUPPLY — 57 items
APL PRP STRL LF DISP 70% ISPRP (MISCELLANEOUS) ×1
APL SRG 38 LTWT LNG FL B (MISCELLANEOUS)
APPLICATOR ARISTA FLEXITIP XL (MISCELLANEOUS) IMPLANT
BAG DRN RND TRDRP ANRFLXCHMBR (UROLOGICAL SUPPLIES) ×1
BAG URINE DRAIN 2000ML AR STRL (UROLOGICAL SUPPLIES) ×2 IMPLANT
BLADE SURG SZ11 CARB STEEL (BLADE) ×2 IMPLANT
CATH FOLEY 2WAY  5CC 16FR (CATHETERS) ×1
CATH FOLEY 2WAY 5CC 16FR (CATHETERS) ×1
CATH ROBINSON RED A/P 16FR (CATHETERS) ×2 IMPLANT
CATH URTH 16FR FL 2W BLN LF (CATHETERS) ×1 IMPLANT
CHLORAPREP W/TINT 26 (MISCELLANEOUS) ×2 IMPLANT
DRAPE SURG 17X11 SM STRL (DRAPES) ×2 IMPLANT
DRSG TEGADERM 2-3/8X2-3/4 SM (GAUZE/BANDAGES/DRESSINGS) ×8 IMPLANT
ELECT REM PT RETURN 9FT ADLT (ELECTROSURGICAL) ×2
ELECTRODE REM PT RTRN 9FT ADLT (ELECTROSURGICAL) ×1 IMPLANT
FILTER LAP SMOKE EVAC STRL (MISCELLANEOUS) ×2 IMPLANT
GAUZE 4X4 16PLY ~~LOC~~+RFID DBL (SPONGE) ×6 IMPLANT
GLOVE SURG SYN 8.0 (GLOVE) ×2 IMPLANT
GOWN STRL REUS W/ TWL LRG LVL3 (GOWN DISPOSABLE) ×3 IMPLANT
GOWN STRL REUS W/ TWL XL LVL3 (GOWN DISPOSABLE) ×1 IMPLANT
GOWN STRL REUS W/TWL LRG LVL3 (GOWN DISPOSABLE) ×6
GOWN STRL REUS W/TWL XL LVL3 (GOWN DISPOSABLE) ×2
GRASPER SUT TROCAR 14GX15 (MISCELLANEOUS) IMPLANT
HEMOSTAT ARISTA ABSORB 3G PWDR (HEMOSTASIS) IMPLANT
IRRIGATION STRYKERFLOW (MISCELLANEOUS) IMPLANT
IRRIGATOR STRYKERFLOW (MISCELLANEOUS)
IV LACTATED RINGERS 1000ML (IV SOLUTION) ×2 IMPLANT
KIT PINK PAD W/HEAD ARE REST (MISCELLANEOUS) ×2
KIT PINK PAD W/HEAD ARM REST (MISCELLANEOUS) ×1 IMPLANT
KIT TURNOVER CYSTO (KITS) ×2 IMPLANT
LABEL OR SOLS (LABEL) ×2 IMPLANT
MANIFOLD NEPTUNE II (INSTRUMENTS) ×2 IMPLANT
NEEDLE HYPO 22GX1.5 SAFETY (NEEDLE) ×2 IMPLANT
PACK BASIN MINOR ARMC (MISCELLANEOUS) ×2 IMPLANT
PACK GYN LAPAROSCOPIC (MISCELLANEOUS) ×2 IMPLANT
PAD OB MATERNITY 4.3X12.25 (PERSONAL CARE ITEMS) ×2 IMPLANT
SCISSORS METZENBAUM CVD 33 (INSTRUMENTS) ×2 IMPLANT
SCRUB EXIDINE 4% CHG 4OZ (MISCELLANEOUS) ×2 IMPLANT
SHEARS HARMONIC ACE PLUS 36CM (ENDOMECHANICALS) ×2 IMPLANT
SLEEVE ENDOPATH XCEL 5M (ENDOMECHANICALS) ×4 IMPLANT
SPONGE GAUZE 2X2 8PLY STRL LF (GAUZE/BANDAGES/DRESSINGS) ×6 IMPLANT
STRIP CLOSURE SKIN 1/2X4 (GAUZE/BANDAGES/DRESSINGS) ×2 IMPLANT
SUT PDS 2-0 27IN (SUTURE) IMPLANT
SUT VIC AB 0 CT1 27 (SUTURE) ×4
SUT VIC AB 0 CT1 27XCR 8 STRN (SUTURE) ×2 IMPLANT
SUT VIC AB 0 CT1 36 (SUTURE) ×2 IMPLANT
SUT VIC AB 0 CT2 27 (SUTURE) IMPLANT
SUT VIC AB 2-0 SH 27 (SUTURE)
SUT VIC AB 2-0 SH 27XBRD (SUTURE) IMPLANT
SUT VIC AB 2-0 UR6 27 (SUTURE) IMPLANT
SUT VIC AB 4-0 SH 27 (SUTURE) ×2
SUT VIC AB 4-0 SH 27XANBCTRL (SUTURE) ×1 IMPLANT
SYR 10ML LL (SYRINGE) ×2 IMPLANT
SYR CONTROL 10ML LL (SYRINGE) ×2 IMPLANT
TROCAR XCEL NON-BLD 5MMX100MML (ENDOMECHANICALS) ×2 IMPLANT
TUBING EVAC SMOKE HEATED PNEUM (TUBING) ×2 IMPLANT
WATER STERILE IRR 500ML POUR (IV SOLUTION) ×2 IMPLANT

## 2020-11-29 NOTE — Transfer of Care (Signed)
Immediate Anesthesia Transfer of Care Note  Patient: Sheri Murray  Procedure(s) Performed: LAPAROSCOPIC ASSISTED VAGINAL HYSTERECTOMY WITH BILATERAL SALPINGO OOPHORECTOMY, EXCISION OF ENDOMETRIOSIS (Bilateral)  Patient Location: PACU  Anesthesia Type:General  Level of Consciousness: drowsy  Airway & Oxygen Therapy: Patient Spontanous Breathing and Patient connected to face mask oxygen  Post-op Assessment: Report given to RN and Post -op Vital signs reviewed and stable  Post vital signs: Reviewed and stable  Last Vitals:  Vitals Value Taken Time  BP 116/54 11/29/20 1000  Temp    Pulse 69 11/29/20 1003  Resp 20 11/29/20 1003  SpO2 100 % 11/29/20 1003  Vitals shown include unvalidated device data.  Last Pain:  Vitals:   11/29/20 0621  TempSrc: Temporal  PainSc: 5          Complications: No notable events documented.

## 2020-11-29 NOTE — Anesthesia Preprocedure Evaluation (Signed)
Anesthesia Evaluation  Patient identified by MRN, date of birth, ID band Patient awake    Reviewed: Allergy & Precautions, H&P , NPO status , Patient's Chart, lab work & pertinent test results, reviewed documented beta blocker date and time   History of Anesthesia Complications (+) PONV and history of anesthetic complications  Airway Mallampati: I  TM Distance: >3 FB Neck ROM: full    Dental  (+) Dental Advidsory Given, Missing, Teeth Intact   Pulmonary neg shortness of breath, neg COPD, neg recent URI, Current Smoker,    Pulmonary exam normal breath sounds clear to auscultation       Cardiovascular Exercise Tolerance: Good negative cardio ROS Normal cardiovascular exam Rhythm:regular Rate:Normal     Neuro/Psych PSYCHIATRIC DISORDERS Anxiety negative neurological ROS     GI/Hepatic negative GI ROS, Neg liver ROS,   Endo/Other  negative endocrine ROS  Renal/GU negative Renal ROS  negative genitourinary   Musculoskeletal   Abdominal   Peds  Hematology negative hematology ROS (+)   Anesthesia Other Findings Past Medical History: No date: Anxiety No date: BRCA1 positive No date: Eczema No date: Endometriosis No date: migraine No date: PONV (postoperative nausea and vomiting)   Reproductive/Obstetrics negative OB ROS                             Anesthesia Physical Anesthesia Plan  ASA: 2  Anesthesia Plan: General   Post-op Pain Management:    Induction: Intravenous  PONV Risk Score and Plan: 3 and Ondansetron, Dexamethasone, Aprepitant, Midazolam, Promethazine and Treatment may vary due to age or medical condition  Airway Management Planned: Oral ETT  Additional Equipment:   Intra-op Plan:   Post-operative Plan: Extubation in OR  Informed Consent: I have reviewed the patients History and Physical, chart, labs and discussed the procedure including the risks, benefits and  alternatives for the proposed anesthesia with the patient or authorized representative who has indicated his/her understanding and acceptance.     Dental Advisory Given  Plan Discussed with: Anesthesiologist, CRNA and Surgeon  Anesthesia Plan Comments:         Anesthesia Quick Evaluation

## 2020-11-29 NOTE — Brief Op Note (Signed)
11/29/2020  9:50 AM  PATIENT:  Sheri Murray  30 y.o. female  PRE-OPERATIVE DIAGNOSIS:  BRCA 1 Gene Defect, Endometriosis, pelvic pain  POST-OPERATIVE DIAGNOSIS:  BRCA 1 Gene Defect, Endometriosis, pelvic pain  PROCEDURE:  Procedure(s): LAPAROSCOPIC ASSISTED VAGINAL HYSTERECTOMY WITH BILATERAL SALPINGO OOPHORECTOMY, EXCISION OF ENDOMETRIOSIS (Bilateral)  SURGEON:  Surgeon(s) and Role:    * Patrece Tallie, Gwen Her, MD - Primary    * Benjaman Kindler, MD - Assisting  PHYSICIAN ASSISTANT:   ASSISTANTS: none   ANESTHESIA:   general  EBL:  20 mL OU 100 cc  IOF 1000cc  BLOOD ADMINISTERED:none  DRAINS: none   LOCAL MEDICATIONS USED:  MARCAINE     SPECIMEN:  Source of Specimen:  cervix , uterus , fallopian tubes and bilateral ovaries   DISPOSITION OF SPECIMEN:  PATHOLOGY  COUNTS:  YES  TOURNIQUET:  * No tourniquets in log *  DICTATION: .Other Dictation: Dictation Number verbal   PLAN OF CARE: Discharge to home after PACU  PATIENT DISPOSITION:  PACU - hemodynamically stable.   Delay start of Pharmacological VTE agent (>24hrs) due to surgical blood loss or risk of bleeding: not applicable

## 2020-11-29 NOTE — Op Note (Signed)
NAME: Sheri Murray, MATUSIK MEDICAL RECORD NO: [id number] ACCOUNT NO: [id number] DATE OF BIRTH: [birth year] FACILITY: [facility] LOCATION: [facility] PHYSICIAN: Boykin Nearing, MD  Operative Report   DATE OF PROCEDURE: 11/29/2020  PREOPERATIVE DIAGNOSES:    1.  Chronic pelvic pain. 2.  Dyspareunia. 3.  BRCA1 gene defect.  POSTOPERATIVE DIAGNOSES: 1.  Chronic pelvic pain. 2.  Dyspareunia. 3.  BRCA1 gene defect. 4.  Endometriosis.  PROCEDURES:  1.  Laparoscopic-assisted vaginal hysterectomy. 2.  Bilateral salpingo-oophorectomy. 3.  Excision of endometriosis.  SURGEON:  Boykin Nearing, MD  FIRST ASSISTANTLeafy Ro.  ANESTHESIA:  General endotracheal anesthesia.  INDICATIONS:  A 30 year old, gravida 1, para 1, with a known BRCA1 gene defect and has chronic pelvic pain with worsening dyspareunia.  The patient wishes definitive surgery for this including removal of fallopian tubes and ovaries given her BRCA1 gene  defect and her risk of ovarian cancer in the future.  DESCRIPTION OF PROCEDURE:  After adequate general endotracheal anesthesia, the patient was placed in dorsal supine position with the legs in the [place] stirrups.  The patient's abdomen, perineum, and vagina were prepped and draped in normal sterile  fashion.  A timeout was performed.  A speculum was placed into the vagina and the anterior cervix was grasped with a single tooth tenaculum and a Kahn cannula was placed into the endocervical canal to be used for uterine manipulation during the  procedure.  Gloves and gown were changed.  Attention was directed to the patient's abdomen where a 5 mm infraumbilical incision was made upon entry into the endometrial cavity. Incision was made after injecting with 0.5% Marcaine.  The 5 mm scope was  advanced into the abdominal cavity under direct visualization with the Optiview cannula.  The patient's abdomen was insufflated with carbon dioxide.  A second port was placed in the  left lower quadrant 3 cm medial to the left anterior iliac spine, and  under direct visualization, a 5 mm trocar was advanced into the abdominal cavity.  Similar procedure was repeated on the right lower quadrant, again 3 cm medial to the right anterior iliac spine.  A 5 mm trocar was advanced under direct visualization.   Initial impression revealed endometriosis on the uterus, both anterior and posterior, and in the left sidewall, there was a significant nodule(5x5 mm)overriding the peritoneum of the left ureter.  This was in the left pelvic sidewall.  Flimsy adhesions were  noted on the left sidewall to the bowel, and there were some omental adhesions that were noted as well.  Procedure after Harmonic scalpel was brought up to the operative field and the left infundibulopelvic ligament was then cauterized and transected.   Dissection through the left broad ligament to the level of the left uterine artery.  The bladder flap was opened with the Harmonic scalpel, and the left uterine artery was cauterized and transected with Harmonic scalpel.  Similar procedure was repeated  on the right side.  Again, the right infundibulopelvic ligament was cauterized and transected in sequential bites through the right broad ligament to the level of the right uterine artery.  This artery was then cauterized and transected, and good  hemostasis was noted.  Attention was directed to the patient's left pelvic sidewall where there was a stellate firm nodule overriding the midportion of the ureter.  Peristaltic activity was seen of the ureters bilaterally before dissection.  The  peritoneum overlying the defect was grasped with [place] graspers and the Harmonic, and the EndoShears were used  to dissect this peritoneum and nodule off of the ureter.  Good hemostasis was noted.  The omental adhesions were taken down with the  Harmonic scalpel and the left colonic adhesions to the sidewall were also taken down with Harmonic  scalpel.  Attention was then directed vaginally where a weighted speculum was placed in the posterior vaginal vault.  The single tooth tenaculum was  replaced with 2 Thyroid tenacula, and the cervix was circumferentially injected with 1% lidocaine with 1:100,000 epinephrine.  A direct posterior colpotomy incision was made upon entry into the posterior cul-de-sac.  The uterosacral ligaments were  bilaterally clamped, transected, and suture ligated with 0 Vicryl suture.  Anterior cervix was then circumferentially incised with the Bovie, and the anterior cul-de-sac was entered without difficulty.  Deaver retractor was placed within to elevate the  bladder anteriorly.  One cardinal bite on each side was needed to free the uterus from the sidewalls.  Cervix, uterus, fallopian tubes, and ovaries were then delivered through the vagina.  Good hemostasis was noted.  The bladder, which had been  previously drained, was recatheterized, and normal amount of clear urine resulted, estimated at 75 mL of urine at the beginning of the case and additional 25 mL of urine after the uterus was delivered.  The vaginal cuff was then closed with a running 0  Vicryl suture and good approximation of edges.  Good hemostasis was noted.  Gloves and gowns were changed again, and attention was directed to the patient's abdomen where the patient's abdomen was reinsufflated.  Good hemostasis was noted.  Abdomen was  irrigated and suctioned.  Upper abdomen was evaluated and the pressure was lowered to 7 mmHg with good hemostasis noted.  Normal peristaltic activity was seen bilaterally.  The patient's abdomen was then deflated.  All three port site incisions were  closed with interrupted 4-0 Vicryl suture.  Good cosmetic effect.  There were no complications.  ESTIMATED BLOOD LOSS: 20 mL.  INTRAOPERATIVE FLUIDS:  1000 mL.  URINE OUTPUT: 100 mL.   [facility] D: 11/29/2020 10:37:29 am T: 11/29/2020 11:32:00 am  JOB: [id number]/ [id number]

## 2020-11-29 NOTE — Progress Notes (Signed)
Pt scheduled for LAVH , bso  brca1 defect . LAbs reviewed . All questions answered . Proceed

## 2020-11-29 NOTE — Anesthesia Postprocedure Evaluation (Signed)
Anesthesia Post Note  Patient: Sheri Murray  Procedure(s) Performed: LAPAROSCOPIC ASSISTED VAGINAL HYSTERECTOMY WITH BILATERAL SALPINGO OOPHORECTOMY, EXCISION OF ENDOMETRIOSIS (Bilateral)  Patient location during evaluation: PACU Anesthesia Type: General Level of consciousness: awake and alert Pain management: pain level controlled Vital Signs Assessment: post-procedure vital signs reviewed and stable Respiratory status: spontaneous breathing, nonlabored ventilation, respiratory function stable and patient connected to nasal cannula oxygen Cardiovascular status: blood pressure returned to baseline and stable Postop Assessment: no apparent nausea or vomiting Anesthetic complications: no   No notable events documented.   Last Vitals:  Vitals:   11/29/20 1114 11/29/20 1224  BP: 110/64 121/68  Pulse: 70 70  Resp: 16 16  Temp: (!) 36.1 C   SpO2: 100% 100%    Last Pain:  Vitals:   11/29/20 1224  TempSrc:   PainSc: 2                  Lenard Simmer

## 2020-11-29 NOTE — Discharge Instructions (Signed)

## 2020-11-29 NOTE — Anesthesia Procedure Notes (Signed)
Procedure Name: Intubation Date/Time: 11/29/2020 7:42 AM Performed by: Katherine Basset, CRNA Pre-anesthesia Checklist: Patient identified, Emergency Drugs available, Suction available and Patient being monitored Patient Re-evaluated:Patient Re-evaluated prior to induction Oxygen Delivery Method: Circle system utilized Preoxygenation: Pre-oxygenation with 100% oxygen Induction Type: IV induction Ventilation: Mask ventilation without difficulty and Oral airway inserted - appropriate to patient size Laryngoscope Size: Hyacinth Meeker and 2 Grade View: Grade I Tube type: Oral Tube size: 7.0 mm Number of attempts: 1 Airway Equipment and Method: Stylet and Oral airway Placement Confirmation: ETT inserted through vocal cords under direct vision, positive ETCO2 and breath sounds checked- equal and bilateral Secured at: 21 cm Tube secured with: Tape Dental Injury: Teeth and Oropharynx as per pre-operative assessment

## 2020-12-02 LAB — SURGICAL PATHOLOGY

## 2020-12-30 ENCOUNTER — Other Ambulatory Visit: Payer: Self-pay | Admitting: Obstetrics and Gynecology

## 2021-07-02 ENCOUNTER — Emergency Department (HOSPITAL_COMMUNITY): Payer: BC Managed Care – PPO

## 2021-07-02 ENCOUNTER — Emergency Department (HOSPITAL_COMMUNITY)
Admission: EM | Admit: 2021-07-02 | Discharge: 2021-07-02 | Disposition: A | Discharge status: Home / Self Care | Payer: BC Managed Care – PPO | Attending: Emergency Medicine | Admitting: Emergency Medicine

## 2021-07-02 ENCOUNTER — Encounter (HOSPITAL_COMMUNITY): Payer: Self-pay

## 2021-07-02 ENCOUNTER — Other Ambulatory Visit: Payer: Self-pay

## 2021-07-02 DIAGNOSIS — Z91040 Latex allergy status: Secondary | ICD-10-CM | POA: Insufficient documentation

## 2021-07-02 DIAGNOSIS — S0990XA Unspecified injury of head, initial encounter: Secondary | ICD-10-CM | POA: Diagnosis present

## 2021-07-02 DIAGNOSIS — R109 Unspecified abdominal pain: Secondary | ICD-10-CM | POA: Diagnosis not present

## 2021-07-02 DIAGNOSIS — Y92410 Unspecified street and highway as the place of occurrence of the external cause: Secondary | ICD-10-CM | POA: Insufficient documentation

## 2021-07-02 DIAGNOSIS — S060X0A Concussion without loss of consciousness, initial encounter: Secondary | ICD-10-CM | POA: Diagnosis not present

## 2021-07-02 LAB — BASIC METABOLIC PANEL
Anion gap: 10 (ref 5–15)
BUN: 15 mg/dL (ref 6–20)
CO2: 23 mmol/L (ref 22–32)
Calcium: 9.7 mg/dL (ref 8.9–10.3)
Chloride: 105 mmol/L (ref 98–111)
Creatinine, Ser: 0.71 mg/dL (ref 0.44–1.00)
GFR, Estimated: >60 mL/min (ref >60)
Glucose, Bld: 94 mg/dL (ref 70–99)
Potassium: 3.9 mmol/L (ref 3.5–5.1)
Sodium: 138 mmol/L (ref 135–145)

## 2021-07-02 LAB — CBC
HCT: 43.4 % (ref 36.0–46.0)
Hemoglobin: 14.3 g/dL (ref 12.0–15.0)
MCH: 30 pg (ref 26.0–34.0)
MCHC: 32.9 g/dL (ref 30.0–36.0)
MCV: 91 fL (ref 80.0–100.0)
Platelets: 172 10*3/uL (ref 150–400)
RBC: 4.77 MIL/uL (ref 3.87–5.11)
RDW: 13.6 % (ref 11.5–15.5)
WBC: 7.2 10*3/uL (ref 4.0–10.5)
nRBC: 0 % (ref 0.0–0.2)

## 2021-07-02 MED ORDER — METHOCARBAMOL 500 MG PO TABS: 500.0000 mg | ORAL_TABLET | Freq: Three times a day (TID) | ORAL | 0 refills | Status: AC | PRN
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
## 2021-07-02 NOTE — ED Notes (Signed)
Patient transported to CT 

## 2021-07-02 NOTE — ED Notes (Signed)
Pt reports she is severely allergic to contrast dye.  ?
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
## 2021-07-02 NOTE — ED Notes (Signed)
Got patient on the monitor  °

## 2023-05-24 ENCOUNTER — Other Ambulatory Visit: Payer: Self-pay

## 2023-05-24 ENCOUNTER — Emergency Department
Admission: EM | Admit: 2023-05-24 | Discharge: 2023-05-24 | Disposition: A | Discharge status: Home / Self Care | Payer: Worker's Compensation | Attending: Student in an Organized Health Care Education/Training Program | Admitting: Student in an Organized Health Care Education/Training Program

## 2023-05-24 ENCOUNTER — Emergency Department: Payer: Worker's Compensation

## 2023-05-24 DIAGNOSIS — Y990 Civilian activity done for income or pay: Secondary | ICD-10-CM | POA: Insufficient documentation

## 2023-05-24 DIAGNOSIS — Z23 Encounter for immunization: Secondary | ICD-10-CM | POA: Insufficient documentation

## 2023-05-24 DIAGNOSIS — S91312A Laceration without foreign body, left foot, initial encounter: Secondary | ICD-10-CM | POA: Insufficient documentation

## 2023-05-24 DIAGNOSIS — S99922A Unspecified injury of left foot, initial encounter: Secondary | ICD-10-CM | POA: Diagnosis present

## 2023-05-24 DIAGNOSIS — S9782XA Crushing injury of left foot, initial encounter: Secondary | ICD-10-CM

## 2023-05-24 DIAGNOSIS — W208XXA Other cause of strike by thrown, projected or falling object, initial encounter: Secondary | ICD-10-CM | POA: Diagnosis not present

## 2023-05-24 MED ORDER — TETANUS-DIPHTH-ACELL PERTUSSIS 5-2.5-18.5 LF-MCG/0.5 IM SUSY
0.5000 mL | PREFILLED_SYRINGE | Freq: Once | INTRAMUSCULAR | Status: AC
  Administered 2023-05-24: 0.5 mL via INTRAMUSCULAR
  Filled 2023-05-24: qty 0.5

## 2023-05-24 MED ORDER — CEPHALEXIN 500 MG PO CAPS: 500.0000 mg | ORAL_CAPSULE | Freq: Three times a day (TID) | ORAL | 0 refills | Status: AC

## 2023-05-24 MED ORDER — TRAMADOL HCL 50 MG PO TABS: 50.0000 mg | ORAL_TABLET | Freq: Four times a day (QID) | ORAL | 0 refills | Status: AC | PRN

## 2023-05-24 MED ORDER — TETANUS-DIPHTHERIA TOXOIDS TD 5-2 LFU IM INJ: 0.5000 mL | INJECTION | Freq: Once | INTRAMUSCULAR | Status: DC

## 2023-05-24 MED ORDER — LIDOCAINE HCL (PF) 1 % IJ SOLN
5.0000 mL | Freq: Once | INTRAMUSCULAR | Status: AC
  Administered 2023-05-24: 5 mL via INTRADERMAL
  Filled 2023-05-24: qty 5

## 2023-05-24 NOTE — Discharge Instructions (Addendum)
 Do not get the sutured area wet for 24 hours. After 24 hours, shower/bathe as usual and pat the area dry. Change the bandage 2 times per day and apply antibiotic ointment. Leave open to air when at no risk of getting the area dirty, but cover at night before bed. See your PCP or go to Urgent Care in 10 days for suture removal or sooner for signs or concern of infection.  Rest, Ice, and Elevate throughout the next 24 hours.

## 2023-05-24 NOTE — ED Triage Notes (Signed)
 Pt comes with c/o injury to left foot. Pt states she was at work and dropped a wheel hub on her foot. Pt has bandage in place. Pt works at [employer].

## 2023-05-24 NOTE — ED Provider Triage Note (Signed)
 Emergency Medicine Provider Triage Evaluation Note  Sheri Murray , a 33 y.o. female  was evaluated in triage.  Pt complains of laceration to left foot, dropped a wheel hub on the foot, cut through her shoe.  Review of Systems  Positive:  Negative:   Physical Exam  BP (!) 185/110   Pulse 83   Temp 98 F (36.7 C)   Resp 18   Ht 5\' 5"  (1.651 m)   Wt 97.1 kg   LMP 10/29/2020   SpO2 100%   BMI 35.61 kg/m  Gen:   Awake, no distress   Resp:  Normal effort  MSK:   Moves extremities without difficulty, laceration noted on the dorsum of the left foot, area is tender to palpation Other:    Medical Decision Making  Medically screening exam initiated at 12:06 PM.  Appropriate orders placed.  Sheri Murray was informed that the remainder of the evaluation will be completed by another provider, this initial triage assessment does not replace that evaluation, and the importance of remaining in the ED until their evaluation is complete.     Faythe Ghee, PA-C 05/24/23 1207

## 2023-06-02 NOTE — ED Provider Notes (Signed)
 [facility] Provider Note    Event Date/Time   First MD Initiated Contact with Patient 05/24/23 1334     (approximate)   History   Foot Injury   HPI  Sheri Murray is a 33 y.o. female  with no significant past medical history and as listed in EMR presents to the emergency department for evaluation of left foot pain after dropping a wheel hub on it while at work. Laceration to top of foot. Tetanus is not current.      Physical Exam   Triage Vital Signs: ED Triage Vitals  Encounter Vitals Group     BP 05/24/23 1051 (!) 185/110     Systolic BP Percentile --      Diastolic BP Percentile --      Pulse Rate 05/24/23 1051 83     Resp 05/24/23 1051 18     Temp 05/24/23 1051 98 F (36.7 C)     Temp src --      SpO2 05/24/23 1051 100 %     Weight 05/24/23 1049 214 lb (97.1 kg)     Height 05/24/23 1049 5\' 5"  (1.651 m)     Head Circumference --      Peak Flow --      Pain Score 05/24/23 1049 10     Pain Loc --      Pain Education --      Exclude from Growth Chart --     Most recent vital signs: Vitals:   05/24/23 1051 05/24/23 1439  BP: (!) 185/110 (!) 168/89  Pulse: 83 81  Resp: 18   Temp: 98 F (36.7 C)   SpO2: 100% 98%    General: Awake, no distress.  CV:  Good peripheral perfusion.  Resp:  Normal effort.  Abd:  No distention.  Other:  2cm laceration to dorsal left foot.   ED Results / Procedures / Treatments   Labs (all labs ordered are listed, but only abnormal results are displayed) Labs Reviewed - No data to display   EKG  Not indicated.   RADIOLOGY  Image and radiology report reviewed and interpreted by me. Radiology report consistent with the same.  No acute bony abnormality of the left foot.  PROCEDURES:  Critical Care performed: No  .Laceration Repair  Date/Time: 06/02/2023 9:42 AM  Performed by: Chinita Pester, FNP Authorized by: Chinita Pester, FNP   Consent:    Consent obtained:  Verbal    Consent given by:  Patient   Risks discussed:  Infection and pain Universal protocol:    Patient identity confirmed:  Verbally with patient Anesthesia:    Anesthesia method:  Local infiltration   Local anesthetic:  Lidocaine 1% w/o epi Laceration details:    Location:  Foot   Length (cm):  2 Treatment:    Area cleansed with:  Povidone-iodine   Amount of cleaning:  Standard   Irrigation solution:  Sterile saline   Irrigation method:  Syringe Skin repair:    Repair method:  Sutures   Suture size:  5-0   Suture material:  Nylon   Suture technique:  Horizontal mattress   Number of sutures:  2 Approximation:    Approximation:  Close Repair type:    Repair type:  Simple Post-procedure details:    Dressing:  Adhesive bandage   Procedure completion:  Tolerated well, no immediate complications    MEDICATIONS ORDERED IN ED:  Medications  Tdap (BOOSTRIX) injection 0.5 mL (0.5 mLs  Intramuscular Given 05/24/23 1431)  lidocaine (PF) (XYLOCAINE) 1 % injection 5 mL (5 mLs Intradermal Given by Other 05/24/23 1415)     IMPRESSION / MDM / ASSESSMENT AND PLAN / ED COURSE   I have reviewed the triage note.  Differential diagnosis includes, but is not limited to, skin laceration, metatarsal fracture, crush injury  Patient's presentation is most consistent with acute illness / injury with system symptoms.  33 year old female presenting to the emergency department for treatment and evaluation after dropping a heavy wheel hub on her foot while at work.  See HPI for further details.  Wound cleaned and repaired as above.  No underlying fracture.  Wound care discussed.  Patient is to have the sutures removed by primary care or urgent care in approximately 10 days.  Tdap was updated today.  ER return precautions discussed.      FINAL CLINICAL IMPRESSION(S) / ED DIAGNOSES   Final diagnoses:  Crushing injury of left foot, initial encounter  Laceration of dorsum of left foot     Rx / DC  Orders   ED Discharge Orders          Ordered    cephALEXin (KEFLEX) 500 MG capsule  3 times daily        05/24/23 1433    traMADol (ULTRAM) 50 MG tablet  Every 6 hours PRN        05/24/23 1433             Note:  This document was prepared using Dragon voice recognition software and may include unintentional dictation errors.   Chinita Pester, FNP 06/02/23 0945    Willy Eddy, MD 06/07/23 [phone]
